# Patient Record
Sex: Female | Born: 1995 | Race: White | Hispanic: No | Marital: Single | Smoking: Never smoker
Health system: Southern US, Community
[De-identification: ages and names within clinical notes are randomized; demographics above are authoritative.]

## PROBLEM LIST (undated history)

## (undated) DIAGNOSIS — E039 Hypothyroidism, unspecified: Secondary | ICD-10-CM

## (undated) DIAGNOSIS — R569 Unspecified convulsions: Secondary | ICD-10-CM

## (undated) DIAGNOSIS — E282 Polycystic ovarian syndrome: Secondary | ICD-10-CM

## (undated) HISTORY — PX: NO PAST SURGERIES: SHX2092

---
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
## 2018-02-14 ENCOUNTER — Emergency Department (HOSPITAL_COMMUNITY): Payer: Self-pay

## 2018-02-14 ENCOUNTER — Other Ambulatory Visit: Payer: Self-pay

## 2018-02-14 ENCOUNTER — Emergency Department (HOSPITAL_COMMUNITY)
Admission: EM | Admit: 2018-02-14 | Discharge: 2018-02-14 | Disposition: A | Discharge status: Home / Self Care | Payer: Self-pay | Attending: Emergency Medicine | Admitting: Emergency Medicine

## 2018-02-14 ENCOUNTER — Encounter (HOSPITAL_COMMUNITY): Payer: Self-pay | Admitting: Emergency Medicine

## 2018-02-14 DIAGNOSIS — E039 Hypothyroidism, unspecified: Secondary | ICD-10-CM | POA: Insufficient documentation

## 2018-02-14 DIAGNOSIS — Z79899 Other long term (current) drug therapy: Secondary | ICD-10-CM | POA: Insufficient documentation

## 2018-02-14 DIAGNOSIS — R0789 Other chest pain: Secondary | ICD-10-CM | POA: Insufficient documentation

## 2018-02-14 DIAGNOSIS — R1011 Right upper quadrant pain: Secondary | ICD-10-CM | POA: Insufficient documentation

## 2018-02-14 DIAGNOSIS — K2970 Gastritis, unspecified, without bleeding: Secondary | ICD-10-CM | POA: Insufficient documentation

## 2018-02-14 HISTORY — DX: Unspecified convulsions: R56.9

## 2018-02-14 HISTORY — DX: Hypothyroidism, unspecified: E03.9

## 2018-02-14 HISTORY — DX: Polycystic ovarian syndrome: E28.2

## 2018-02-14 LAB — CBC
HCT: 46.5 % — ABNORMAL HIGH (ref 36.0–46.0)
Hemoglobin: 15 g/dL (ref 12.0–15.0)
MCH: 29.2 pg (ref 26.0–34.0)
MCHC: 32.3 g/dL (ref 30.0–36.0)
MCV: 90.6 fL (ref 78.0–100.0)
PLATELETS: 248 10*3/uL (ref 150–400)
RBC: 5.13 MIL/uL — AB (ref 3.87–5.11)
RDW: 11.7 % (ref 11.5–15.5)
WBC: 7.1 10*3/uL (ref 4.0–10.5)

## 2018-02-14 LAB — BASIC METABOLIC PANEL
Anion gap: 10 (ref 5–15)
BUN: 7 mg/dL (ref 6–20)
CALCIUM: 9.1 mg/dL (ref 8.9–10.3)
CO2: 23 mmol/L (ref 22–32)
CREATININE: 0.65 mg/dL (ref 0.44–1.00)
Chloride: 107 mmol/L (ref 98–111)
GFR calc non Af Amer: >60 mL/min (ref >60)
GLUCOSE: 102 mg/dL — AB (ref 70–99)
Potassium: 4.1 mmol/L (ref 3.5–5.1)
Sodium: 140 mmol/L (ref 135–145)

## 2018-02-14 LAB — HEPATIC FUNCTION PANEL
ALBUMIN: 3.9 g/dL (ref 3.5–5.0)
ALK PHOS: 68 U/L (ref 38–126)
ALT: 24 U/L (ref 0–44)
AST: 21 U/L (ref 15–41)
BILIRUBIN DIRECT: 0.1 mg/dL (ref 0.0–0.2)
BILIRUBIN TOTAL: 0.4 mg/dL (ref 0.3–1.2)
Indirect Bilirubin: 0.3 mg/dL (ref 0.3–0.9)
Total Protein: 7 g/dL (ref 6.5–8.1)

## 2018-02-14 LAB — I-STAT TROPONIN, ED: TROPONIN I, POC: 0 ng/mL (ref 0.00–0.08)

## 2018-02-14 LAB — D-DIMER, QUANTITATIVE: D-Dimer, Quant: 0.79 ug/mL-FEU — ABNORMAL HIGH (ref 0.00–0.50)

## 2018-02-14 LAB — LIPASE, BLOOD: LIPASE: 40 U/L (ref 11–51)

## 2018-02-14 LAB — HCG, QUANTITATIVE, PREGNANCY: hCG, Beta Chain, Quant, S: 1 m[IU]/mL (ref <5)

## 2018-02-14 LAB — I-STAT CREATININE, ED: Creatinine, Ser: 0.7 mg/dL (ref 0.44–1.00)

## 2018-02-14 MED ORDER — MORPHINE SULFATE (PF) 4 MG/ML IV SOLN
2.0000 mg | Freq: Once | INTRAVENOUS | Status: AC
  Administered 2018-02-14: 2 mg via INTRAVENOUS
  Filled 2018-02-14: qty 1

## 2018-02-14 MED ORDER — DICYCLOMINE HCL 20 MG PO TABS: 20.0000 mg | ORAL_TABLET | Freq: Two times a day (BID) | ORAL | 0 refills | Status: DC

## 2018-02-14 MED ORDER — IOPAMIDOL (ISOVUE-370) INJECTION 76%
INTRAVENOUS | Status: AC
  Filled 2018-02-14: qty 100

## 2018-02-14 MED ORDER — SODIUM CHLORIDE 0.9 % IV BOLUS
1000.0000 mL | Freq: Once | INTRAVENOUS | Status: AC
  Administered 2018-02-14: 1000 mL via INTRAVENOUS

## 2018-02-14 MED ORDER — IOPAMIDOL (ISOVUE-370) INJECTION 76%
100.0000 mL | Freq: Once | INTRAVENOUS | Status: AC | PRN
  Administered 2018-02-14: 100 mL via INTRAVENOUS

## 2018-02-14 MED ORDER — ACETAMINOPHEN 325 MG PO TABS: 650.0000 mg | ORAL_TABLET | Freq: Four times a day (QID) | ORAL | 0 refills | Status: DC | PRN

## 2018-02-14 MED ORDER — FAMOTIDINE 20 MG PO TABS: 20.0000 mg | ORAL_TABLET | Freq: Two times a day (BID) | ORAL | 0 refills | Status: DC

## 2018-02-14 MED ORDER — ONDANSETRON HCL 4 MG/2ML IJ SOLN
4.0000 mg | Freq: Once | INTRAMUSCULAR | Status: AC
  Administered 2018-02-14: 4 mg via INTRAVENOUS
  Filled 2018-02-14: qty 2

## 2018-02-14 MED ORDER — ACETAMINOPHEN 325 MG PO TABS
650.0000 mg | ORAL_TABLET | Freq: Once | ORAL | Status: AC
Start: 2018-02-14 — End: 2018-02-14
  Administered 2018-02-14: 650 mg via ORAL
  Filled 2018-02-14: qty 2

## 2018-02-14 NOTE — ED Notes (Signed)
ED Provider at bedside. 

## 2018-02-14 NOTE — ED Notes (Signed)
Patient transported to Ultrasound 

## 2018-02-14 NOTE — ED Notes (Signed)
Patient transported to X-ray 

## 2018-02-14 NOTE — ED Notes (Signed)
Nurse collected addl labs.

## 2018-02-14 NOTE — ED Provider Notes (Signed)
[facility] EMERGENCY DEPARTMENT Provider Note   CSN: [id number] Arrival date & time: 02/14/18  1809     History   Chief Complaint Chief Complaint  Patient presents with  . Chest Pain    HPI Caitlin Davenport is a 22 y.o. female with a past medical history of PCOS, hypothyroidism, who presents to ED for evaluation of 2-week history of right-sided abdominal pain chest pain.  Pain radiates to the right side of her neck.  Reports associated nausea.  Pain got worse since yesterday.  Worse with inspiration and movement.  She also reports intermittent shortness of breath.  She has been told in the past that she had "sludge in my gallbladder," proximally 3 years ago.  She has not followed up with anyone about it in the time being.  Her mother is concerned that she may have a blood clot because of her tachycardia, the fact that she is on birth control pills and the fact that her mother herself had a saddle embolism.  She has not tried any medicine to help with her symptoms.  Denies any vomiting, hemoptysis, leg swelling, prior PE, MI, DVT, fever, bowel changes, urinary symptoms.  HPI  Past Medical History:  Diagnosis Date  . Hypothyroid   . Polycystic ovarian syndrome   . Seizures (HCC)     There are no active problems to display for this patient.   History reviewed. No pertinent surgical history.   OB History   None      Home Medications    Prior to Admission medications   Medication Sig Start Date End Date Taking? Authorizing Provider  JUNEL FE 1/20 1-20 MG-MCG tablet Take 1 tablet by mouth daily. 01/31/18  Yes [provider]  levothyroxine (SYNTHROID, LEVOTHROID) 50 MCG tablet Take 50 mcg by mouth daily. 01/13/18  Yes [provider]  phentermine (ADIPEX-P) 37.5 MG tablet Take 37.5 mg by mouth daily. 02/07/18  Yes [provider]  acetaminophen (TYLENOL) 325 MG tablet Take 2 tablets (650 mg total) by mouth every 6 (six) hours as needed.  02/14/18   Mckynlee Luse, PA-C  dicyclomine (BENTYL) 20 MG tablet Take 1 tablet (20 mg total) by mouth 2 (two) times daily. 02/14/18   Yida Hyams, PA-C  famotidine (PEPCID) 20 MG tablet Take 1 tablet (20 mg total) by mouth 2 (two) times daily. 02/14/18   Dietrich Pates, PA-C    Family History History reviewed. No pertinent family history.  Social History Social History   Tobacco Use  . Smoking status: Never Smoker  . Smokeless tobacco: Never Used  Substance Use Topics  . Alcohol use: Yes    Comment: occ  . Drug use: Never     Allergies   Patient has no known allergies.   Review of Systems Review of Systems  Constitutional: Negative for appetite change, chills and fever.  HENT: Negative for ear pain, rhinorrhea, sneezing and sore throat.   Eyes: Negative for photophobia and visual disturbance.  Respiratory: Positive for shortness of breath. Negative for cough, chest tightness and wheezing.   Cardiovascular: Positive for chest pain. Negative for palpitations and leg swelling.  Gastrointestinal: Positive for abdominal pain and nausea. Negative for blood in stool, constipation, diarrhea and vomiting.  Genitourinary: Negative for dysuria, hematuria and urgency.  Musculoskeletal: Negative for myalgias.  Skin: Negative for rash.  Neurological: Negative for dizziness, weakness and light-headedness.     Physical Exam Updated Vital Signs BP 125/81   Pulse 91   Temp 98.3  F (36.8 C) (Oral)   Resp 20   Ht 5\' 6"  (1.676 m)   Wt 95.3 kg (210 lb)   LMP 01/13/2018   SpO2 98%   BMI 33.89 kg/m   Physical Exam  Constitutional: She appears well-developed and well-nourished. No distress.  HENT:  Head: Normocephalic and atraumatic.  Nose: Nose normal.  Eyes: Conjunctivae and EOM are normal. Left eye exhibits no discharge. No scleral icterus.  Neck: Normal range of motion. Neck supple.  Cardiovascular: Regular rhythm, normal heart sounds and intact distal pulses. Tachycardia present.  Exam reveals no gallop and no friction rub.  No murmur heard. Pulmonary/Chest: Effort normal and breath sounds normal. No respiratory distress.  Abdominal: Soft. Bowel sounds are normal. She exhibits no distension. There is tenderness in the right upper quadrant. There is no guarding.    Musculoskeletal: Normal range of motion. She exhibits no edema.  No lower extremity edema, erythema or calf tenderness bilaterally.  Neurological: She is alert. She exhibits normal muscle tone. Coordination normal.  Skin: Skin is warm and dry. No rash noted.  Psychiatric: She has a normal mood and affect.  Nursing note and vitals reviewed.    ED Treatments / Results  Labs (all labs ordered are listed, but only abnormal results are displayed) Labs Reviewed  BASIC METABOLIC PANEL - Abnormal; Notable for the following components:      Result Value   Glucose, Bld 102 (*)    All other components within normal limits  CBC - Abnormal; Notable for the following components:   RBC 5.13 (*)    HCT 46.5 (*)    All other components within normal limits  D-DIMER, QUANTITATIVE (NOT AT [facility]) - Abnormal; Notable for the following components:   D-Dimer, Quant 0.79 (*)    All other components within normal limits  HCG, QUANTITATIVE, PREGNANCY  HEPATIC FUNCTION PANEL  LIPASE, BLOOD  I-STAT TROPONIN, ED  I-STAT BETA HCG BLOOD, ED ([facility], [facility], [facility] ONLY)  I-STAT CREATININE, ED    EKG None  Radiology Dg Chest 2 View  Result Date: 02/14/2018 CLINICAL DATA:  Cp. Pt stated that she's been having severe intermittent cp for several weeks that is getting consistently worse by the day. Pt said the pain is in her middle chest, under both breasts, in epigastric area, and in her right neck and clavicle. Pt describes cp as a dull severe pain that's like indigestion x 10. Pt has also experienced sob w/ her chest pain, as well as dizziness, and nausea. Hx; hypothyroid, seizures. Pt was unable to remove her nipple rings. EXAM:  CHEST - 2 VIEW COMPARISON:  None. FINDINGS: The heart size and mediastinal contours are within normal limits. Both lungs are clear. No pleural effusion or pneumothorax. The visualized skeletal structures are unremarkable. IMPRESSION: Normal chest radiographs. Electronically Signed   By: Amie Portland M.D.   On: 02/14/2018 19:27   Ct Angio Chest Pe W/cm &/or Wo Cm  Result Date: 02/14/2018 CLINICAL DATA:  Elevated D-dimer.  Chest pain. EXAM: CT ANGIOGRAPHY CHEST WITH CONTRAST TECHNIQUE: Multidetector CT imaging of the chest was performed using the standard protocol during bolus administration of intravenous contrast. Multiplanar CT image reconstructions and MIPs were obtained to evaluate the vascular anatomy. CONTRAST:  ISOVUE-370 IOPAMIDOL (ISOVUE-370) INJECTION 76% COMPARISON:  Chest radiograph from earlier today. FINDINGS: Cardiovascular: The study is high quality for the evaluation of pulmonary embolism. There are no filling defects in the central, lobar, segmental or subsegmental pulmonary artery branches to suggest acute  pulmonary embolism. Great vessels are normal in course and caliber. Top-normal heart size. No significant pericardial fluid/thickening. Mediastinum/Nodes: No discrete thyroid nodules. Unremarkable esophagus. No pathologically enlarged axillary, mediastinal or hilar lymph nodes. Triangular soft tissue density in the anterior mediastinum with internal speckled fat density is compatible with atrophic thymic tissue (series 6/image 42). Lungs/Pleura: No pneumothorax. No pleural effusion. No acute consolidative airspace disease or lung masses. Solid subpleural anterior bilateral lower lobe 3 mm pulmonary nodules (series 8/image 60 on the right and image 72 on the left). Upper abdomen: No acute abnormality. Musculoskeletal:  No aggressive appearing focal osseous lesions. Review of the MIP images confirms the above findings. IMPRESSION: 1. No pulmonary embolism. 2. Tiny solid pulmonary nodules  in the lower lobes, subpleural, almost certainly benign. No follow-up is required unless the patient has significant risk factors for lung malignancy. Electronically Signed   By: Delbert Phenix M.D.   On: 02/14/2018 22:43   US Abdomen Limited Ruq  Result Date: 02/14/2018 CLINICAL DATA:  Right upper quadrant pain for 2 weeks. EXAM: ULTRASOUND ABDOMEN LIMITED RIGHT UPPER QUADRANT COMPARISON:  None. FINDINGS: Gallbladder: No gallstones or wall thickening visualized. No sonographic Murphy sign noted by sonographer. Common bile duct: Diameter: 3 mm Liver: No focal lesion identified. Within normal limits in parenchymal echogenicity. Portal vein is patent on color Doppler imaging with normal direction of blood flow towards the liver. IMPRESSION: Normal right upper quadrant ultrasound. Electronically Signed   By: Amie Portland M.D.   On: 02/14/2018 20:36    Procedures Procedures (including critical care time)  Medications Ordered in ED Medications  iopamidol (ISOVUE-370) 76 % injection (has no administration in time range)  sodium chloride 0.9 % bolus 1,000 mL (0 mLs Intravenous Stopped 02/14/18 2321)  morphine 4 MG/ML injection 2 mg (2 mg Intravenous Given 02/14/18 1947)  ondansetron (ZOFRAN) injection 4 mg (4 mg Intravenous Given 02/14/18 1947)  iopamidol (ISOVUE-370) 76 % injection 100 mL (100 mLs Intravenous Contrast Given 02/14/18 2210)  acetaminophen (TYLENOL) tablet 650 mg (650 mg Oral Given 02/14/18 2325)     Initial Impression / Assessment and Plan / ED Course  I have reviewed the triage vital signs and the nursing notes.  Pertinent labs & imaging results that were available during my care of the patient were reviewed by me and considered in my medical decision making (see chart for details).     22yo female with a past medical history of PCOS presents for 2-week history of right-sided abdominal pain and chest pain.  Pain radiates to the right side of her neck and is associated with nausea.   She also reports intermittent shortness of breath.  She had been told 3 years ago that she had sludge in her gallbladder.  Her mother is also concerned that she may have a blood clot in her lungs because of her tachycardia and her birth control use.  Mother herself was diagnosed with a saddle embolism recently.  Patient denies any leg swelling, recent surgeries, recent prolonged travel, vomiting, fever, prior MI, PE or DVT.  On physical exam patient is overall well-appearing.  She does have some right upper quadrant tenderness to palpation with no rebound or guarding noted.  She was initially mildly tachycardic.  She has no lower extremity edema or calf tenderness bilaterally.  Remainder vital signs are reassuring.  Labs including CBC, CMP, hCG, lipase, troponin all unremarkable.  D-dimer is elevated 0.78. CXR unremarkable. RUQ U/S is negative. CTA of chest is negative for PE.  Suspect that her symptoms could be due to gastritis or musculoskeletal pain.  Will advise her to follow-up with GI specialist for ongoing problems and will send home with antispasmodics, antacids.  Doubt cardiac or pulmonary cause of symptoms as she is low risk by heart score.  Advised to return to ED for any severe worsening symptoms.  Portions of this note were generated with Scientist, clinical (histocompatibility and immunogenetics)Dragon dictation software. Dictation errors may occur despite best attempts at proofreading.  Final Clinical Impressions(s) / ED Diagnoses   Final diagnoses:  RUQ pain  Chest wall pain  Gastritis without bleeding, unspecified chronicity, unspecified gastritis type    ED Discharge Orders        Ordered    famotidine (PEPCID) 20 MG tablet  2 times daily     02/14/18 2324    dicyclomine (BENTYL) 20 MG tablet  2 times daily     02/14/18 2324    acetaminophen (TYLENOL) 325 MG tablet  Every 6 hours PRN     02/14/18 2324       Dietrich PatesKhatri, Marteze Vecchio, PA-C 02/14/18 2335    Azalia Bilisampos, Kevin, MD 02/15/18 [phone]

## 2018-02-14 NOTE — ED Triage Notes (Signed)
Pt states she has been having CP for several weeks- intermittently. Pt states it is severe in nature and goes into her right neck, epigastrium. Pt also feels very nauseous.

## 2018-02-14 NOTE — Discharge Instructions (Signed)
Return to ED for any worsening symptoms, severe chest pain or shortness of breath, coughing up blood, leg swelling, vomiting up blood.

## 2018-12-01 IMAGING — CT CT ANGIO CHEST
2 of 6 series · 18 of 36 positions shown · IV contrast (Omni 300)
Comparison: Chest radiograph from earlier today.

CLINICAL DATA: Elevated D-dimer.  Chest pain.

EXAM:
CT ANGIOGRAPHY CHEST WITH CONTRAST
TECHNIQUE: Multidetector CT imaging of the chest was performed using the
standard protocol during bolus administration of intravenous
contrast. Multiplanar CT image reconstructions and MIPs were
obtained to evaluate the vascular anatomy.
CONTRAST:  100mL KCTQS5-LPG IOPAMIDOL (KCTQS5-LPG) INJECTION 76%

[Series 7: pe thins · axial · 0.66mm/px · z∈[+1275,+1488]mm · 17 of 241 slices shown]
[im 14/241  lung]
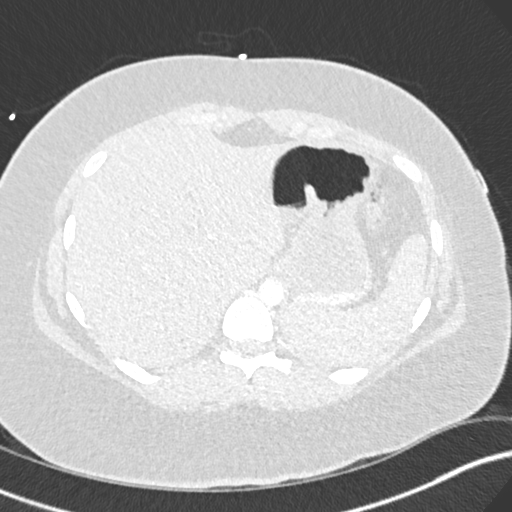
[im 27/241  mediastinal]
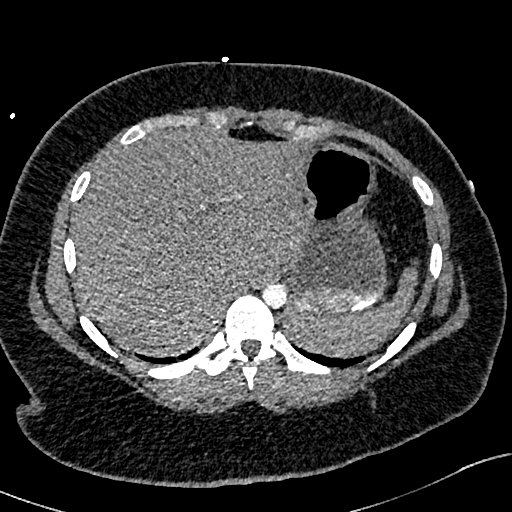
[im 41/241  lung]
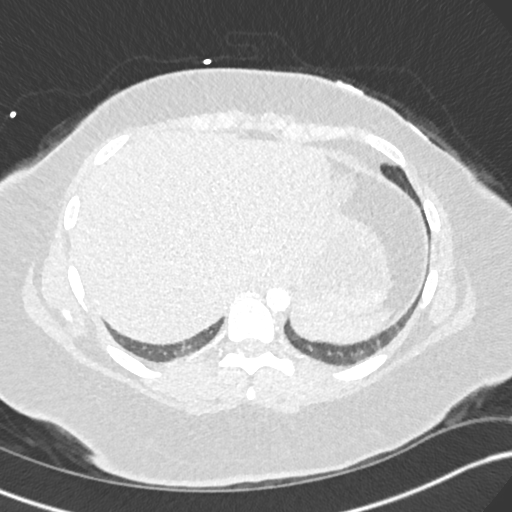
[im 54/241  mediastinal]
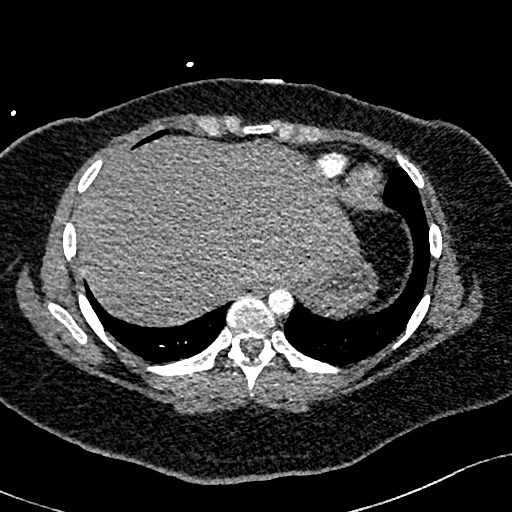
[im 67/241  lung]
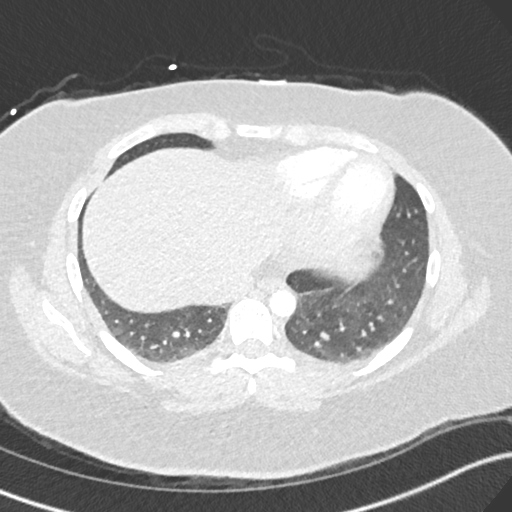
[im 81/241  mediastinal]
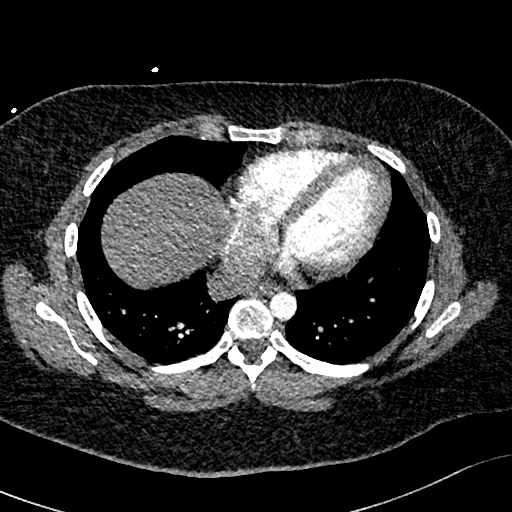
[im 94/241  lung]
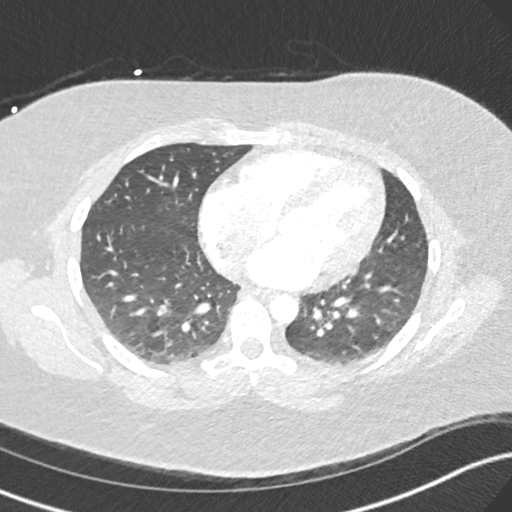
[im 107/241  mediastinal]
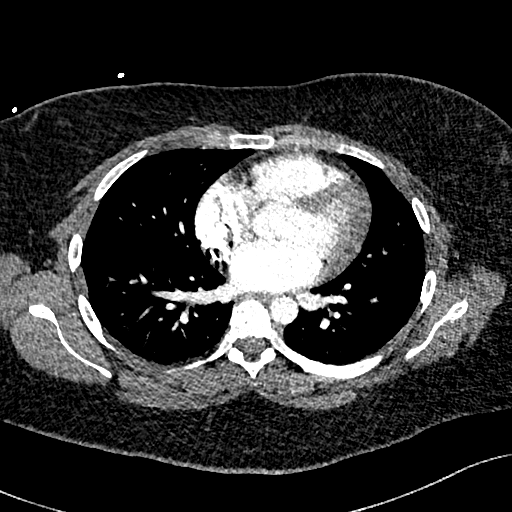
[im 121/241  lung]
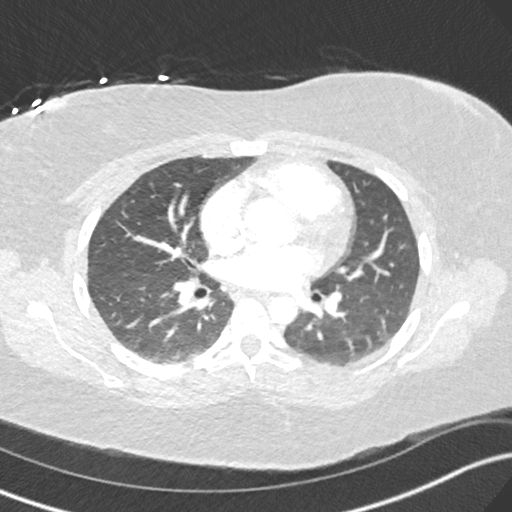
[im 134/241  mediastinal]
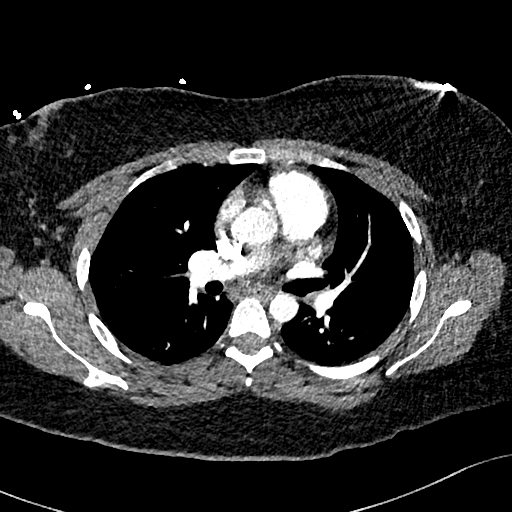
[im 147/241  lung]
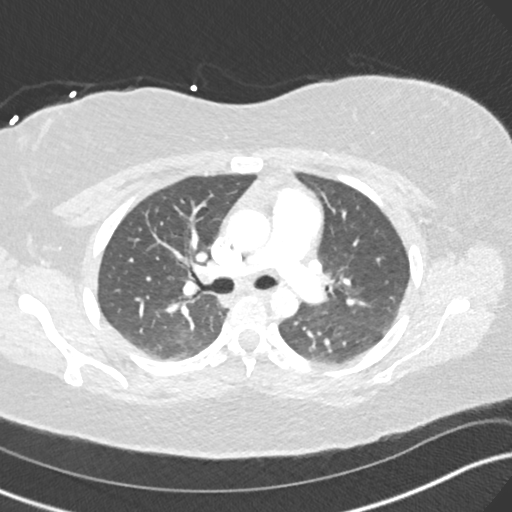
[im 161/241  mediastinal]
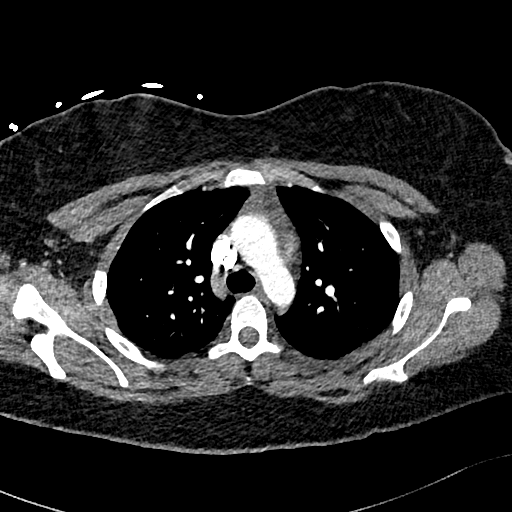
[im 174/241  lung]
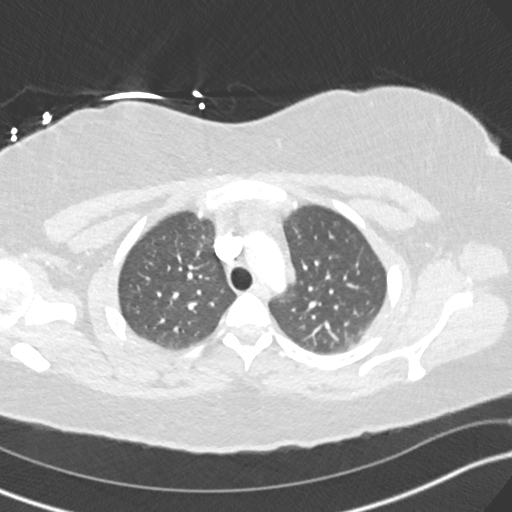
[im 187/241  mediastinal]
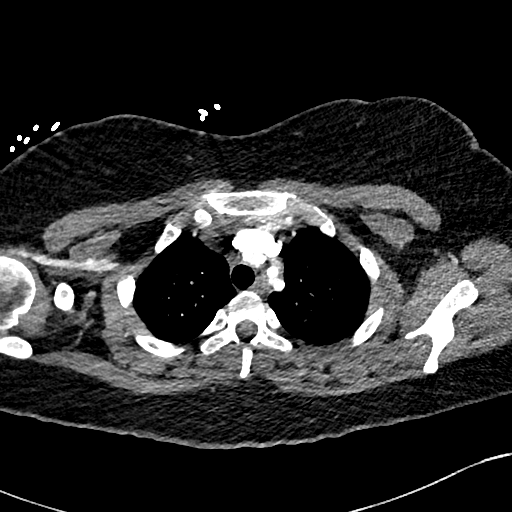
[im 201/241  lung]
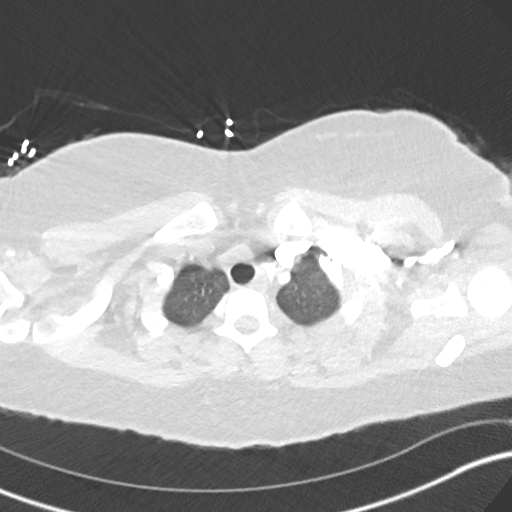
[im 214/241  mediastinal]
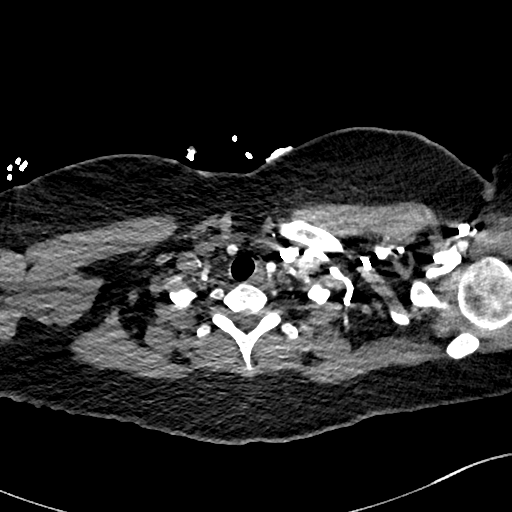
[im 227/241  lung]
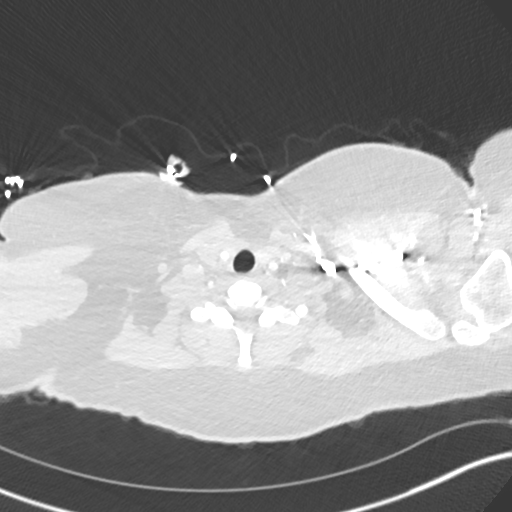

[Series 9: pe 2mm cor · coronal · 0.48mm/px · 1 of 132 slices shown]
[im 66/132  mediastinal]
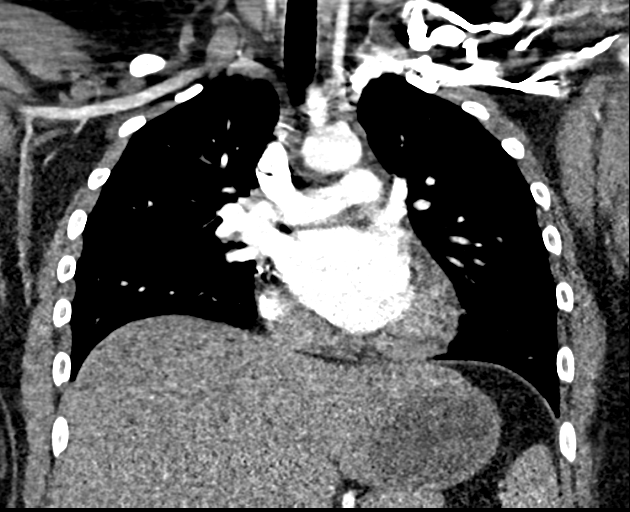

[18 of 36 positions shown; findings below may reference images not displayed]

FINDINGS: Cardiovascular: The study is high quality for the evaluation of
pulmonary embolism. There are no filling defects in the central,
lobar, segmental or subsegmental pulmonary artery branches to
suggest acute pulmonary embolism. Great vessels are normal in course
and caliber. Top-normal heart size. No significant pericardial
fluid/thickening.

Mediastinum/Nodes: No discrete thyroid nodules. Unremarkable
esophagus. No pathologically enlarged axillary, mediastinal or hilar
lymph nodes. Triangular soft tissue density in the anterior
mediastinum with internal speckled fat density is compatible with
atrophic thymic tissue (series 6/image 42).

Lungs/Pleura: No pneumothorax. No pleural effusion. No acute
consolidative airspace disease or lung masses. Solid subpleural
anterior bilateral lower lobe 3 mm pulmonary nodules (series 8/image
60 on the right and image 72 on the left).

Upper abdomen: No acute abnormality.

Musculoskeletal:  No aggressive appearing focal osseous lesions.

Review of the MIP images confirms the above findings.
IMPRESSION: 1. No pulmonary embolism.
2. Tiny solid pulmonary nodules in the lower lobes, subpleural,
almost certainly benign. No follow-up is required unless the patient
has significant risk factors for lung malignancy.

## 2018-12-01 IMAGING — DX DG CHEST 2V
2 series · 2 of 2 positions shown · non-contrast
Comparison: None.

CLINICAL DATA: Cp. Pt stated that she's been having severe
intermittent cp for several weeks that is getting consistently worse
by the day. Pt said the pain is in her middle chest, under both
breasts, in epigastric area, and in her right neck and clavicle. Pt
describes cp as a dull severe pain that's like indigestion x 10. Pt
has also experienced sob w/ her chest pain, as well as dizziness,
and nausea. Hx; hypothyroid, seizures. Pt was unable to remove her
nipple rings.

EXAM:
CHEST - 2 VIEW

[chest pa]
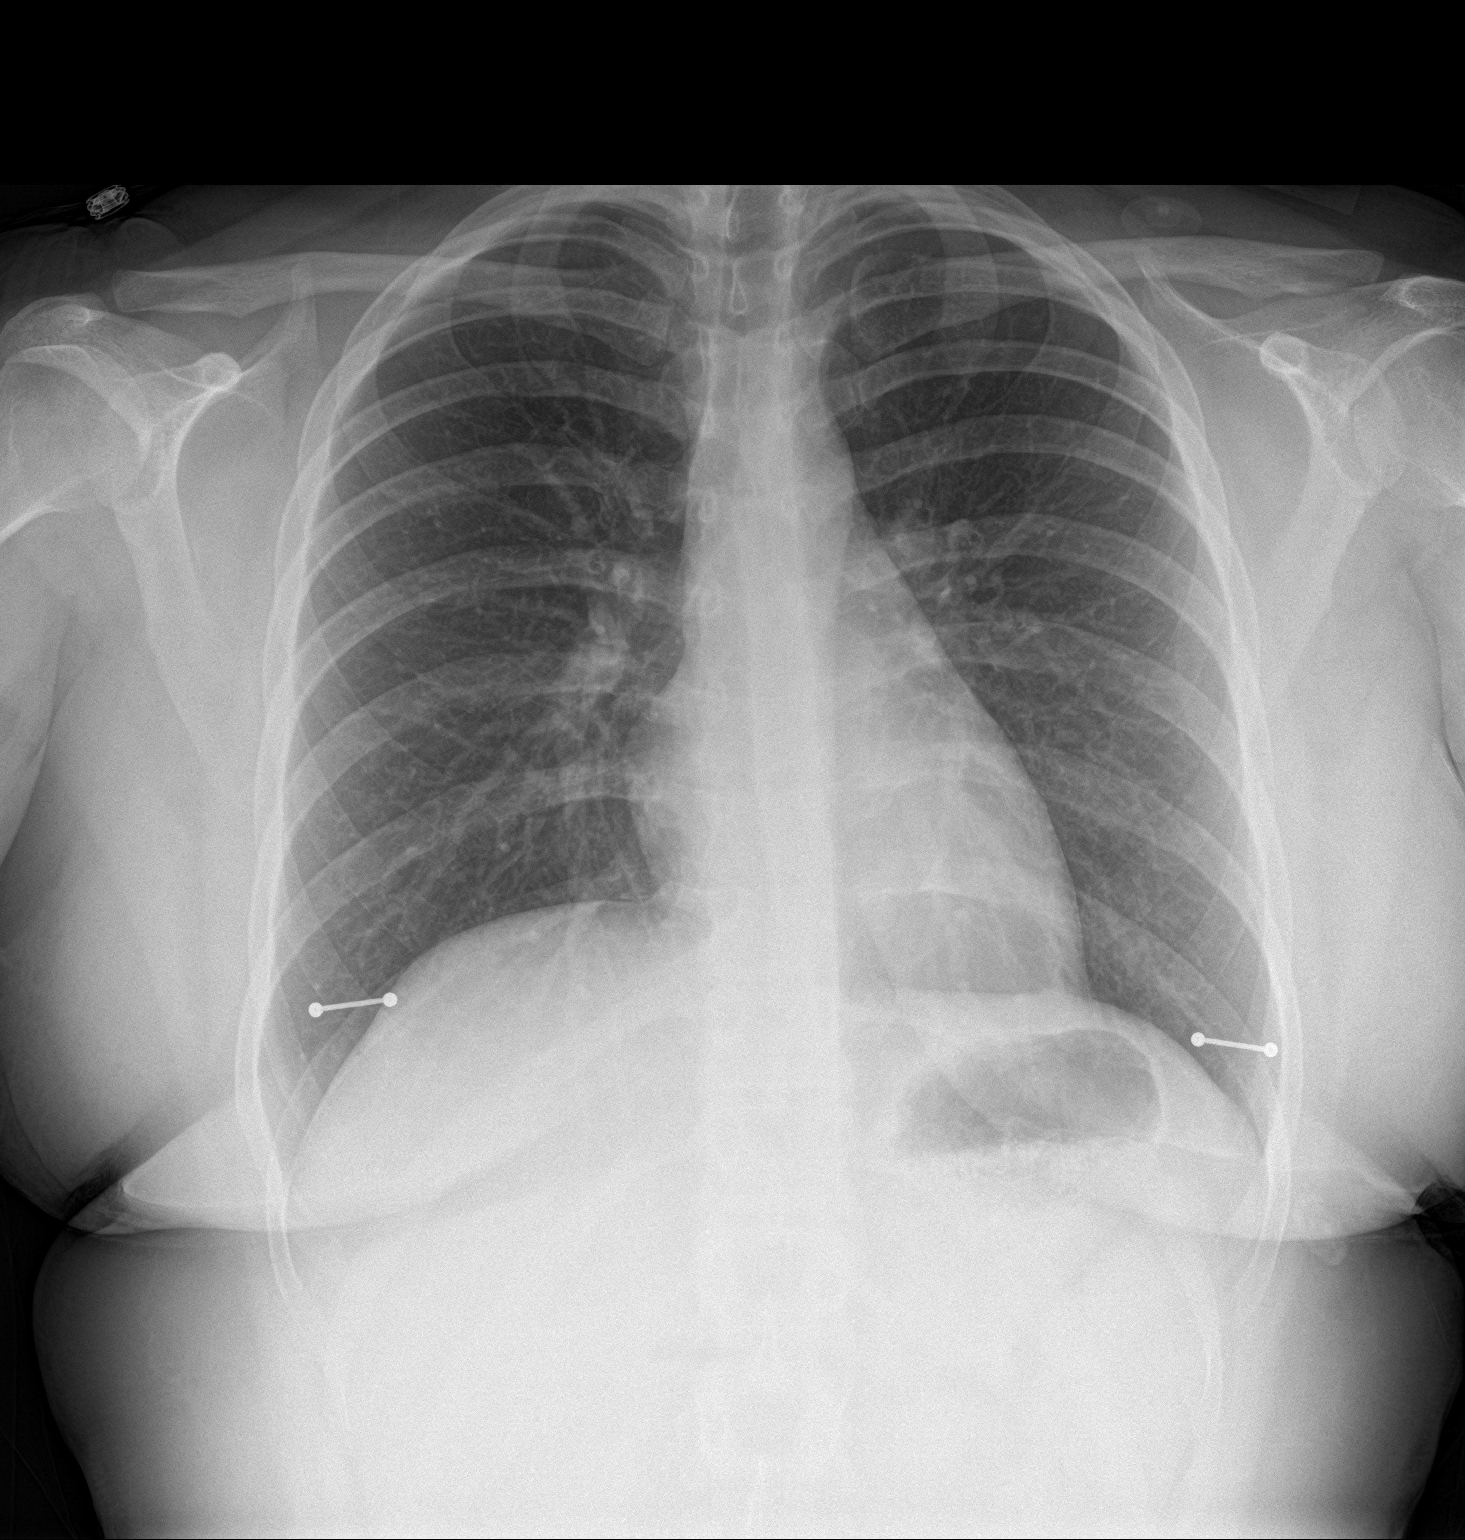

[chest lat]
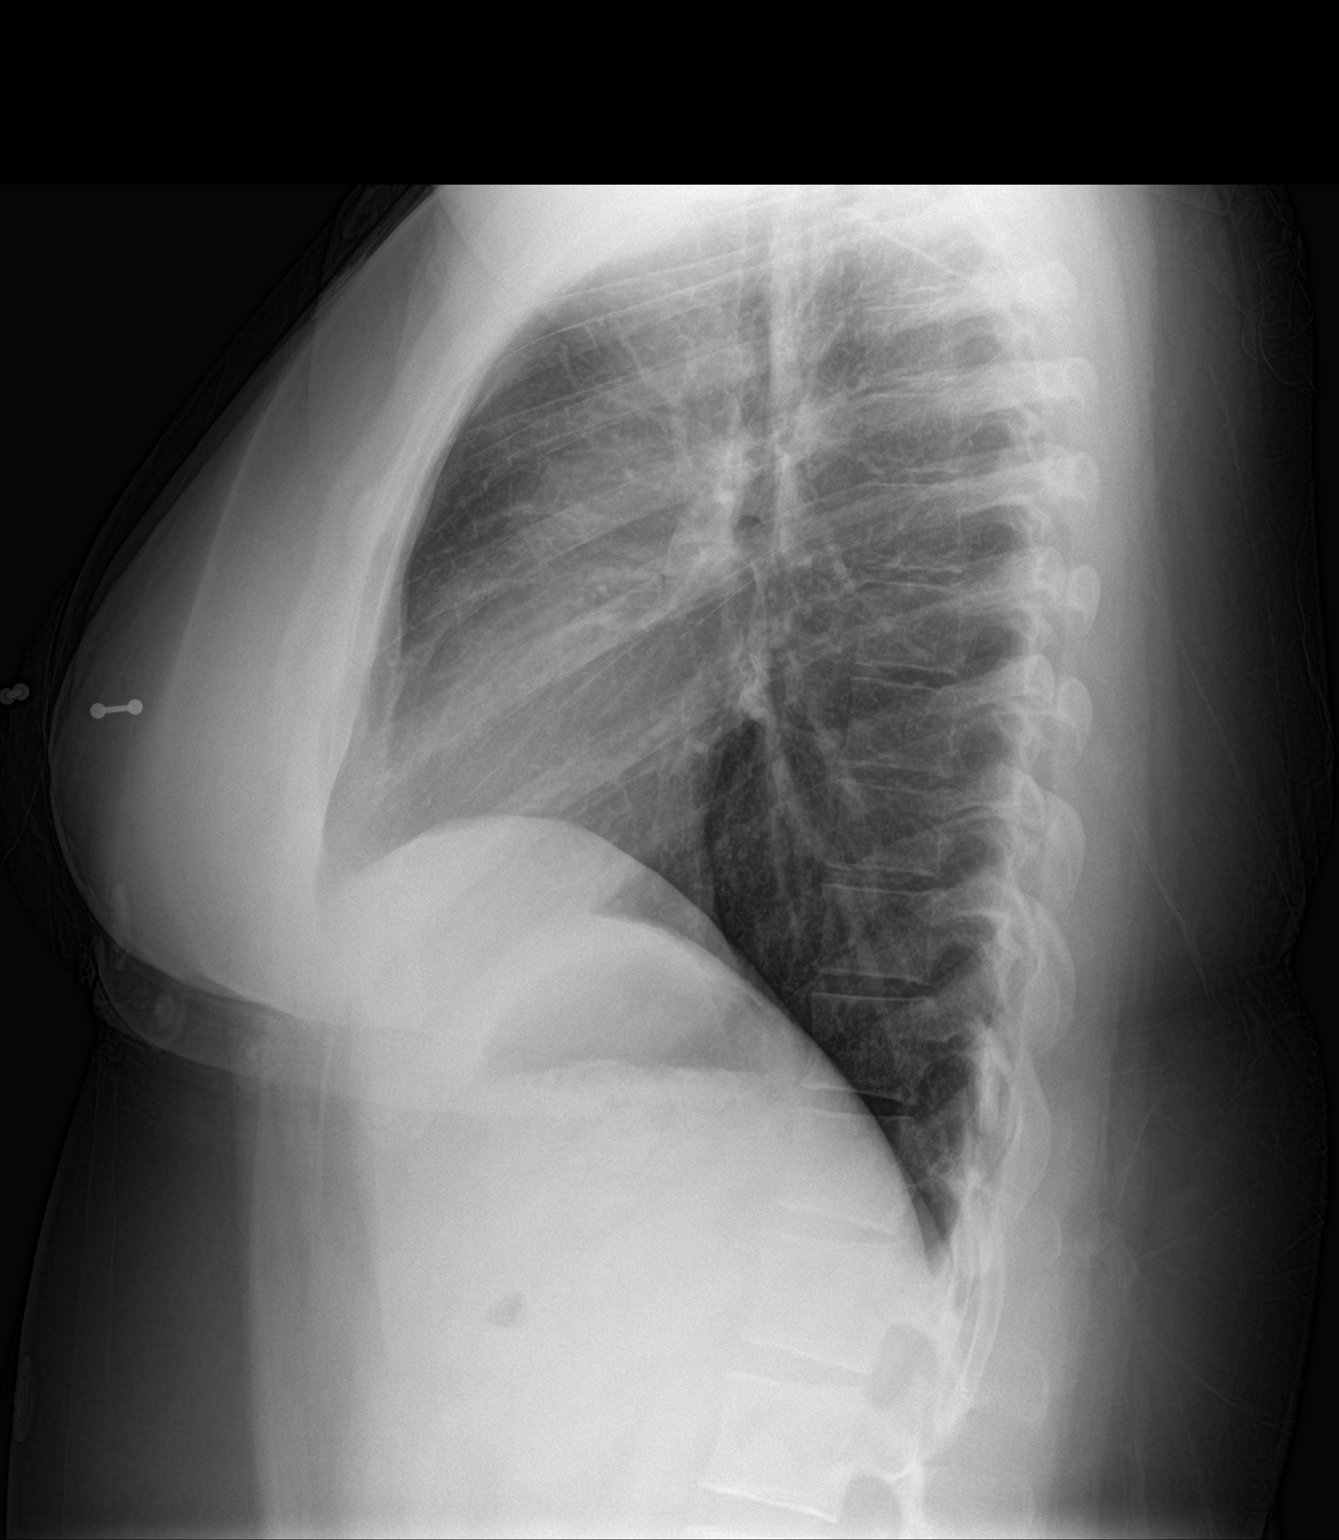

[2 of 2 positions shown; findings below may reference images not displayed]

FINDINGS: The heart size and mediastinal contours are within normal limits.
Both lungs are clear. No pleural effusion or pneumothorax. The
visualized skeletal structures are unremarkable.
IMPRESSION: Normal chest radiographs.

## 2019-06-13 ENCOUNTER — Other Ambulatory Visit: Payer: Self-pay

## 2019-06-13 ENCOUNTER — Inpatient Hospital Stay (HOSPITAL_COMMUNITY)
Admission: AD | Admit: 2019-06-13 | Discharge: 2019-06-17 | DRG: 177 | Disposition: A | Discharge status: Home / Self Care | Payer: HRSA Program | Source: Other Acute Inpatient Hospital | Attending: Internal Medicine | Admitting: Internal Medicine

## 2019-06-13 ENCOUNTER — Encounter (HOSPITAL_COMMUNITY): Payer: Self-pay

## 2019-06-13 DIAGNOSIS — J9601 Acute respiratory failure with hypoxia: Secondary | ICD-10-CM | POA: Diagnosis present

## 2019-06-13 DIAGNOSIS — E039 Hypothyroidism, unspecified: Secondary | ICD-10-CM

## 2019-06-13 DIAGNOSIS — Z79890 Hormone replacement therapy (postmenopausal): Secondary | ICD-10-CM | POA: Diagnosis not present

## 2019-06-13 DIAGNOSIS — R569 Unspecified convulsions: Secondary | ICD-10-CM

## 2019-06-13 DIAGNOSIS — J1289 Other viral pneumonia: Secondary | ICD-10-CM | POA: Diagnosis present

## 2019-06-13 DIAGNOSIS — J069 Acute upper respiratory infection, unspecified: Secondary | ICD-10-CM

## 2019-06-13 DIAGNOSIS — U071 COVID-19: Secondary | ICD-10-CM | POA: Diagnosis present

## 2019-06-13 DIAGNOSIS — G40909 Epilepsy, unspecified, not intractable, without status epilepticus: Secondary | ICD-10-CM | POA: Diagnosis present

## 2019-06-13 DIAGNOSIS — Z79899 Other long term (current) drug therapy: Secondary | ICD-10-CM

## 2019-06-13 HISTORY — DX: Acute upper respiratory infection, unspecified: J06.9

## 2019-06-13 HISTORY — DX: Unspecified convulsions: R56.9

## 2019-06-13 HISTORY — DX: COVID-19: U07.1

## 2019-06-13 HISTORY — DX: Hypothyroidism, unspecified: E03.9

## 2019-06-13 MED ORDER — NORETHIN ACE-ETH ESTRAD-FE 1-20 MG-MCG PO TABS: 1.0000 | ORAL_TABLET | Freq: Every day | ORAL | Status: DC

## 2019-06-13 MED ORDER — ONDANSETRON HCL 4 MG/2ML IJ SOLN: 4.0000 mg | Freq: Four times a day (QID) | INTRAMUSCULAR | Status: DC | PRN

## 2019-06-13 MED ORDER — LEVOTHYROXINE SODIUM 75 MCG PO TABS
75.0000 ug | ORAL_TABLET | Freq: Every day | ORAL | Status: DC
  Administered 2019-06-13 – 2019-06-16 (×4): 75 ug via ORAL
  Filled 2019-06-13 (×4): qty 1

## 2019-06-13 MED ORDER — DEXAMETHASONE 6 MG PO TABS
6.0000 mg | ORAL_TABLET | ORAL | Status: DC
  Administered 2019-06-13 – 2019-06-16 (×4): 6 mg via ORAL
  Filled 2019-06-13 (×4): qty 1

## 2019-06-13 MED ORDER — ONDANSETRON HCL 4 MG PO TABS: 4.0000 mg | ORAL_TABLET | Freq: Four times a day (QID) | ORAL | Status: DC | PRN

## 2019-06-13 MED ORDER — ZINC SULFATE 220 (50 ZN) MG PO CAPS
220.0000 mg | ORAL_CAPSULE | Freq: Every day | ORAL | Status: DC
  Administered 2019-06-13 – 2019-06-17 (×5): 220 mg via ORAL
  Filled 2019-06-13 (×5): qty 1

## 2019-06-13 MED ORDER — DOCUSATE SODIUM 100 MG PO CAPS
100.0000 mg | ORAL_CAPSULE | Freq: Two times a day (BID) | ORAL | Status: DC
  Administered 2019-06-13 – 2019-06-16 (×6): 100 mg via ORAL
  Filled 2019-06-13 (×9): qty 1

## 2019-06-13 MED ORDER — FAMOTIDINE 20 MG PO TABS
20.0000 mg | ORAL_TABLET | Freq: Two times a day (BID) | ORAL | Status: DC
  Administered 2019-06-13 – 2019-06-17 (×8): 20 mg via ORAL
  Filled 2019-06-13 (×9): qty 1

## 2019-06-13 MED ORDER — SODIUM CHLORIDE 0.9 % IV SOLN
100.0000 mg | INTRAVENOUS | Status: DC
  Administered 2019-06-14 – 2019-06-16 (×3): 100 mg via INTRAVENOUS
  Filled 2019-06-13 (×4): qty 20

## 2019-06-13 MED ORDER — HYDROCOD POLST-CPM POLST ER 10-8 MG/5ML PO SUER
5.0000 mL | Freq: Two times a day (BID) | ORAL | Status: DC | PRN
  Administered 2019-06-16: 5 mL via ORAL
  Filled 2019-06-13: qty 5

## 2019-06-13 MED ORDER — ACETAMINOPHEN 325 MG PO TABS: 650.0000 mg | ORAL_TABLET | Freq: Four times a day (QID) | ORAL | Status: DC | PRN

## 2019-06-13 MED ORDER — GUAIFENESIN-DM 100-10 MG/5ML PO SYRP
10.0000 mL | ORAL_SOLUTION | ORAL | Status: DC | PRN
  Administered 2019-06-14 – 2019-06-15 (×4): 10 mL via ORAL
  Filled 2019-06-13 (×4): qty 10

## 2019-06-13 MED ORDER — VITAMIN C 500 MG PO TABS
500.0000 mg | ORAL_TABLET | Freq: Every day | ORAL | Status: DC
  Administered 2019-06-13 – 2019-06-17 (×5): 500 mg via ORAL
  Filled 2019-06-13 (×5): qty 1

## 2019-06-13 MED ORDER — SODIUM CHLORIDE 0.9 % IV SOLN
200.0000 mg | Freq: Once | INTRAVENOUS | Status: AC
  Administered 2019-06-13: 200 mg via INTRAVENOUS
  Filled 2019-06-13: qty 40

## 2019-06-13 MED ORDER — ENOXAPARIN SODIUM 40 MG/0.4ML ~~LOC~~ SOLN
40.0000 mg | SUBCUTANEOUS | Status: DC
  Administered 2019-06-13 – 2019-06-16 (×4): 40 mg via SUBCUTANEOUS
  Filled 2019-06-13 (×4): qty 0.4

## 2019-06-13 MED ORDER — LEVETIRACETAM ER 500 MG PO TB24
1000.0000 mg | ORAL_TABLET | Freq: Every day | ORAL | Status: DC
  Administered 2019-06-14 – 2019-06-16 (×4): 1000 mg via ORAL
  Filled 2019-06-13 (×4): qty 2

## 2019-06-13 MED ORDER — VITAMIN D (ERGOCALCIFEROL) 1.25 MG (50000 UNIT) PO CAPS
50000.0000 [IU] | ORAL_CAPSULE | ORAL | Status: DC
  Administered 2019-06-13: 50000 [IU] via ORAL
  Filled 2019-06-13: qty 1

## 2019-06-13 NOTE — Progress Notes (Signed)
Pharmacy Note - Remdesivir Dosing  O:  ALT: 84  CXR: multifocal pneumonia Requiring supplemental O2: 1L [place]   A/P:  Patient meets criteria for remdesivir.  Begin remdesivir 200 mg IV x 1, followed by 100 mg IV daily x 4 days  Monitor ALT, clinical progress  Peggyann Juba, PharmD, [place] [phone] 06/13/2019 3:57 PM

## 2019-06-13 NOTE — H&P (Signed)
TRH H&P   Patient Demographics:    Lakaya Tolen, is a 23 y.o. female  MRN: [id number]   DOB - 1996/07/28  Admit Date - 06/13/2019  Outpatient Primary MD for the patient is Patient, No Pcp Per  Referring MD/NP/PA: PA Caitlyn  Patient coming from: [facility] ED  No chief complaint on file.     HPI:    Finnleigh Marchetti  is a 23 y.o. female, with past medical history of epilepsy, hypothyroidism, patient presents to [facility] ED secondary to shortness of breath, patient was diagnosed with COVID-19 10/14, she reports generalized weakness, cough, shortness of breath, some sputum production, fever, chills, generalized body ache, no nausea, no vomiting, no diarrhea, patient had ED visit 10/19, where she was discharged home given she was not hypoxic, patient report worsening shortness of breath over last few days, patient mother is hospitalized here at [facility] for COVID-19 for pneumonia as well. -At [facility] ED patient was noted to be hypoxic 87% on room air, requiring 2 L nasal cannula, chest x-ray significant for bilateral opacity, initially tachycardic at 140, then this improved after she received steroids and started on oxygen, D-dimers were elevated at 760 (normal is 500), ferritin elevated at 748, LDH elevated at 890, platelet count was 123K, around her baseline, CRP elevated at 40, (normal is 10), she had negative urine pregnancy test, procalcitonin was normal, she was given IV steroids and transferred to [facility] for further management.    Review of systems:    In addition to the HPI above,  Reports fever, chills, generalized weakness and fatigue No Headache, No changes with Vision or hearing, No problems swallowing food or Liquids, No Chest pain, complains of cough and shortness of breath No Abdominal pain, No Nausea or Vommitting, Bowel movements are regular, No Blood  in stool or Urine, No dysuria, No new skin rashes or bruises, No new joints pains-aches,  No new weakness, tingling, numbness in any extremity, No recent weight gain or loss, No polyuria, polydypsia or polyphagia, No significant Mental Stressors.  A full 10 point Review of Systems was done, except as stated above, all other Review of Systems were negative.   With Past History of the following :    Past Medical History:  Diagnosis Date  . Hypothyroid   . Polycystic ovarian syndrome   . Seizures (HCC)       History reviewed. No pertinent surgical history.    Social History:     Social History   Tobacco Use  . Smoking status: Never Smoker  . Smokeless tobacco: Never Used  Substance Use Topics  . Alcohol use: Yes    Comment: occ       Family History :   No family history of diabetes or high blood pressure in the family, family history significant for COVID-19 and her mom, and breast cancer for maternal grandmother.  Home Medications:   Prior to Admission medications   Medication Sig Start Date End Date Taking? Authorizing Provider  acetaminophen (TYLENOL) 500 MG tablet Take 1,000 mg by mouth See admin instructions. Take 1,000 mg by mouth every 4-6 hours as needed for fever or mild pain   Yes [provider]  levETIRAcetam (KEPPRA XR) 500 MG 24 hr tablet Take 1,000 mg by mouth at bedtime.   Yes [provider]  levothyroxine (SYNTHROID) 75 MCG tablet Take 75 mcg by mouth at bedtime.   Yes [provider]  Vitamin D, Ergocalciferol, (DRISDOL) 1.25 MG (50000 UT) CAPS capsule Take 50,000 Units by mouth every Friday. 12/17/18  Yes [provider]  acetaminophen (TYLENOL) 325 MG tablet Take 2 tablets (650 mg total) by mouth every 6 (six) hours as needed. Patient not taking: Reported on 06/13/2019 02/14/18   Dietrich PatesKhatri, Hina, PA-C  dicyclomine (BENTYL) 20 MG tablet Take 1 tablet (20 mg total) by mouth 2 (two) times daily. Patient not taking:  Reported on 06/13/2019 02/14/18   Dietrich PatesKhatri, Hina, PA-C  famotidine (PEPCID) 20 MG tablet Take 1 tablet (20 mg total) by mouth 2 (two) times daily. Patient not taking: Reported on 06/13/2019 02/14/18   Dietrich PatesKhatri, Hina, PA-C  JUNEL FE 1/20 1-20 MG-MCG tablet Take 1 tablet by mouth daily. 01/31/18   [provider]  phentermine (ADIPEX-P) 37.5 MG tablet Take 37.5 mg by mouth daily. 02/07/18   [provider]     Allergies:    No Known Allergies   Physical Exam:   Vitals  Blood pressure 120/70, pulse 68, temperature 99.3 F (37.4 C), temperature source Oral, resp. rate 20, height 5\' 6"  (1.676 m), weight 84.6 kg, SpO2 97 %.   1. General developed female laying in bed in no apparent distress  2. Normal affect and insight, Not Suicidal or Homicidal, Awake Alert, Oriented X 3.  3. No F.N deficits, ALL C.Nerves Intact, Strength 5/5 all 4 extremities, Sensation intact all 4 extremities, Plantars down going.  4. Ears and Eyes appear Normal, Conjunctivae clear, PERRLA. Moist Oral Mucosa.  5. Supple Neck, No JVD, No cervical lymphadenopathy appriciated, No Carotid Bruits.  6. Symmetrical Chest wall movement, Good air movement bilaterally, CTAB.  7. RRR, No Gallops, Rubs or Murmurs, No Parasternal Heave.  8. Positive Bowel Sounds, Abdomen Soft, No tenderness, No organomegaly appriciated,No rebound -guarding or rigidity.  9.  No Cyanosis, Normal Skin Turgor, No Skin Rash or Bruise.  10. Good muscle tone,  joints appear normal , no effusions, Normal ROM.  11. No Palpable Lymph Nodes in Neck or Axillae   SHEENT was seen and examined with the presence of female chaperone Melissa RN   Data Review:    06/13/19 08:53: Sodium 140, Potassium 3.6, Chloride 105, Carbon Dioxide 26, Anion Gap 13, BUN 7, Creatinine 0.70, Estimated GFR (MDRD) > 60, Glucose 121 H, Calculated Osmolality 268 L, Calcium 8.2 L, Prot Corrected Calcium 8.5, Total Bilirubin 0.7, AST 76 H, ALT 84 H, Alkaline  Phosphatase 86, Lactate Dehydrogenase 890 H, Troponin I < 0.01, C-Reactive Prot, Quant 40.3 H, Pro-B-Natriuretic Pept 25, Total Protein 6.8, Albumin 3.7 06/13/19 08:53: Procalcitonin < 0.05 06/13/19 08:53: Lactic Acid 1.3 06/13/19 08:53: Ferritin 748.0 H 06/13/19 08:53: WBC 3.8, RBC 5.12, Hgb 15.0, Hct 44.5, MCV 87, MCH 29.4, MCHC 33.8, RDW 12.7, Plt Count 123 L, MPV 9.1, Neut % (Auto) 54.7, Lymph % (Auto) 37.3, Mono % (Auto) 6.5, Eos % (Auto) 1.1, Baso % (Auto) 0.4, Absolute Neuts (auto)  2.05, Absolute Lymphs (auto) 1.41 06/13/19 08:53: PT 10.6, INR 1.0, APTT 27.9 06/13/19 08:53: D-Dimer Quant (PE/DVT) 769 H 06/13/19 09:02: Urine Pregnancy Test NEG 06/13/19 09:02: Urine Color YELLOW, Urine Clarity CLEAR, Urine pH 6.0, Ur Specific Gravity 1.010, Urine Protein NEG, Urine Glucose (UA) NEG, Urine Ketones NEG, Urine Occult Blood NEG, Urine Nitrite NEG, Urine Bilirubin NEG, Urine Urobilinogen 0.2, Ur Leukocyte Esterase NEG, Urine RBC 0-2, Urine WBC 2-5, Ur Epithelial Cells 2+, Urine Bacteria FEW, Urine Mucus OCC   Imaging Results:    Exam(s): [id number] RAD/DG CHEST PORTABLE CLINICAL DATA:  Patient presents with gradual worsening shortness of breath and cough as well as fevers, chills and myalgias. History reports patient has COVID-19.  EXAM: PORTABLE CHEST 1 VIEW  COMPARISON:  06/09/2019  FINDINGS: Cardiac silhouette normal in size. Normal mediastinal contours. No hilar masses or convincing adenopathy.  Subtle hazy perihilar opacities are suggested. Lungs otherwise clear. No pleural effusion or pneumothorax.  Skeletal structures are grossly intact.  IMPRESSION: 1. Subtle perihilar airspace lung opacities are suggested consistent with multifocal pneumonia. This would be better assessed with chest CT without contrast.   Electronically Signed   By: Lajean Manes M.D.   On: 06/13/2019 10:25  Assessment & Plan:    Active Problems:   Hypothyroidism   Seizure ([place])   Acute  respiratory disease due to COVID-19 virus    Acute hypoxic respiratory failure due to COVID-19 for pneumonia -Patient hypoxic on room air 87%, requiring 2 L nasal cannula, chest x-ray significant for bilateral opacities, CRP elevated, ferritin is elevated, LDH is elevated, patient started on IV Decadron, and IV remdesivir, will continue to trend inflammatory markers closely, discussed at length with the patient, she was encouraged to use incentive spirometry, flutter valve, out of bed to chair and to prone.  History of seizures -Continue with home dose Keppra  Hypothyroidism -Continue with Synthroid  Patient reports she has stopped taking phentermine for more than 1 year, will not order during hospital stay  DVT Prophylaxis  Lovenox - SCDs   AM Labs Ordered, also please review Full Orders  Family Communication: Admission, patients condition and plan of care including tests being ordered have been discussed with the patient who indicate understanding and agree with the plan and Code Status.  Code Status full  Likely DC to home  Condition GUARDED   Consults called: None  Admission status: Inpatient  Time spent in minutes : 55 minutes   Phillips Climes M.D on 06/13/2019 at 5:55 PM  Between 7am to 7pm - Pager - [phone]. After 7pm go to [email or web address] - password [facility]  [facility] - Office  [phone]

## 2019-06-13 NOTE — Plan of Care (Signed)
Care plan started

## 2019-06-13 NOTE — Progress Notes (Addendum)
RN called stating that pt didn't feel comfortable taking generic Keppra as she doesn't think that it works for her. I spoke with patient about the bioequivalence of generics and let her know that we do not carry the brand name Keppra. She wants to have her family bring in from home tomorrow. She will think about whether she will take tonight's dose as a generic.  Sherlon Handing, PharmD, BCPS CGV Clinical pharmacist phone [phone] 06/13/2019 10:23 PM    Addendum: Family brought in Brand Name Keppra XR 500mg . Being stored in pharmacy vault and we will be dispensing each dose and tubing secure to RN.  Sherlon Handing, PharmD, BCPS CGV Clinical pharmacist phone [phone] 06/14/2019 1:05 AM

## 2019-06-14 LAB — CBC WITH DIFFERENTIAL/PLATELET
Abs Immature Granulocytes: 0.01 10*3/uL (ref 0.00–0.07)
Basophils Absolute: 0 10*3/uL (ref 0.0–0.1)
Basophils Relative: 1 %
Eosinophils Absolute: 0 10*3/uL (ref 0.0–0.5)
Eosinophils Relative: 0 %
HCT: 44.9 % (ref 36.0–46.0)
Hemoglobin: 15 g/dL (ref 12.0–15.0)
Immature Granulocytes: 0 %
Lymphocytes Relative: 33 %
Lymphs Abs: 0.7 10*3/uL (ref 0.7–4.0)
MCH: 29.6 pg (ref 26.0–34.0)
MCHC: 33.4 g/dL (ref 30.0–36.0)
MCV: 88.7 fL (ref 80.0–100.0)
Monocytes Absolute: 0.1 10*3/uL (ref 0.1–1.0)
Monocytes Relative: 5 %
Neutro Abs: 1.3 10*3/uL — ABNORMAL LOW (ref 1.7–7.7)
Neutrophils Relative %: 61 %
Platelets: 156 10*3/uL (ref 150–400)
RBC: 5.06 MIL/uL (ref 3.87–5.11)
RDW: 12.4 % (ref 11.5–15.5)
WBC: 2.2 10*3/uL — ABNORMAL LOW (ref 4.0–10.5)
nRBC: 0 % (ref 0.0–0.2)

## 2019-06-14 LAB — ABO/RH: ABO/RH(D): A POS

## 2019-06-14 LAB — COMPREHENSIVE METABOLIC PANEL
ALT: 64 U/L — ABNORMAL HIGH (ref 0–44)
AST: 43 U/L — ABNORMAL HIGH (ref 15–41)
Albumin: 3.8 g/dL (ref 3.5–5.0)
Alkaline Phosphatase: 68 U/L (ref 38–126)
Anion gap: 12 (ref 5–15)
BUN: 8 mg/dL (ref 6–20)
CO2: 22 mmol/L (ref 22–32)
Calcium: 8.9 mg/dL (ref 8.9–10.3)
Chloride: 108 mmol/L (ref 98–111)
Creatinine, Ser: 0.64 mg/dL (ref 0.44–1.00)
GFR calc Af Amer: >60 mL/min (ref >60)
GFR calc non Af Amer: >60 mL/min (ref >60)
Glucose, Bld: 155 mg/dL — ABNORMAL HIGH (ref 70–99)
Potassium: 4.1 mmol/L (ref 3.5–5.1)
Sodium: 142 mmol/L (ref 135–145)
Total Bilirubin: 0.7 mg/dL (ref 0.3–1.2)
Total Protein: 7.5 g/dL (ref 6.5–8.1)

## 2019-06-14 LAB — D-DIMER, QUANTITATIVE: D-Dimer, Quant: 0.66 ug/mL-FEU — ABNORMAL HIGH (ref 0.00–0.50)

## 2019-06-14 LAB — HIV ANTIBODY (ROUTINE TESTING W REFLEX): HIV Screen 4th Generation wRfx: NONREACTIVE

## 2019-06-14 LAB — C-REACTIVE PROTEIN: CRP: 2.7 mg/dL — ABNORMAL HIGH (ref <1.0)

## 2019-06-14 NOTE — Progress Notes (Signed)
PROGRESS NOTE                                                                                                                                                                                                             Patient Demographics:    Caitlin Davenport, is a 23 y.o. female, DOB - 07-28-1996, PIR:[id number]  Admit date - 06/13/2019   Admitting Physician Albertine Patricia, MD  Outpatient Primary MD for the patient is Patient, No Pcp Per  LOS - 1   No chief complaint on file.      Brief Narrative    23 y.o. female, with past medical history of epilepsy, hypothyroidism, patient presents to [facility] ED secondary to shortness of breath, patient was diagnosed with COVID-19 10/14, she reports generalized weakness, cough, shortness of breath, some sputum production, fever, chills, generalized body ache, no nausea, no vomiting, no diarrhea, patient had ED visit 10/19, where she was discharged home given she was not hypoxic, patient report worsening shortness of breath over last few days, patient mother is hospitalized here at [facility] for COVID-19 for pneumonia as well. -At [facility] ED patient was noted to be hypoxic 87% on room air, requiring 2 L nasal cannula, chest x-ray significant for bilateral opacity, initially tachycardic at 140, then this improved after she received steroids and started on oxygen, D-dimers were elevated at 760 (normal is 500), ferritin elevated at 748, LDH elevated at 890, platelet count was 123K, around her baseline, CRP elevated at 40, (normal is 10), she had negative urine pregnancy test, procalcitonin was normal, she was given IV steroids and transferred to [facility] for further management.    Subjective:    Arleene Settle today report dyspnea has improved, reports some cough, denies nausea, chest pain, fever or chills .   Assessment  & Plan :    Active Problems:   Hypothyroidism   Seizure ([place])  Acute respiratory disease due to COVID-19 virus   Acute hypoxic respiratory failure due to COVID-19 for pneumonia - Patient hypoxic on room air 87% on room air on presentation at [facility] ED, requiring 2 L nasal cannula. - chest x-ray significant for bilateral opacities -Continue with IV Decadron. -Continue with IV remdesivir x5 days -Continue to trend inflammatory markers closely, CRP appears to be trending down which is reassuring(as compared to [facility]  ED results). -She was encouraged again to use incentive spirometry, and to get out of bed to chair, and to prone once back in bed. -Continue with zinc and vitamin C  COVID-19 Labs  Recent Labs    06/14/19 0110  DDIMER 0.66*  CRP 2.7*    No results found for: SARSCOV2NAA  History of seizures -Continue with home dose Keppra  Hypothyroidism -Continue with Synthroid  -Patient appears to be anxious today, report she was told [facility], and she noted her pulse be in the mid 30s overnight on the continuous pulse oximetry, I told her pulse oximetry would be the accurate, but given her anxiety I will repeat EKG, and monitor her on telemetry overnight to reassure her.   Code Status : Full  Family Communication  : D/W patient  Disposition Plan  : home  Barriers For Discharge : IV steroids and IV remdesivir  Consults  :  None  Procedures  : None  DVT Prophylaxis  : [place] lovenox  Lab Results  Component Value Date   PLT 156 06/14/2019    Antibiotics  :    Anti-infectives (From admission, onward)   Start     Dose/Rate Route Frequency Ordered Stop   06/14/19 1600  remdesivir 100 mg in sodium chloride 0.9 % 250 mL IVPB     100 mg 500 mL/hr over 30 Minutes Intravenous Every 24 hours 06/13/19 1554 06/18/19 1559   06/13/19 1800  remdesivir 200 mg in sodium chloride 0.9 % 250 mL IVPB     200 mg 500 mL/hr over 30 Minutes Intravenous Once 06/13/19 1554 06/13/19 1834        Objective:    Vitals:   06/13/19 1931 06/14/19 0459 06/14/19 0509 06/14/19 0727  BP: 117/80 117/81  125/76  Pulse: 69 60 64 60  Resp: 19 18 18 20   Temp: 98.9 F (37.2 C) 98.7 F (37.1 C)  (!) 97.5 F (36.4 C)  TempSrc: Oral Oral  Oral  SpO2: 98% 97% 98% 97%  Weight:      Height:        Wt Readings from Last 3 Encounters:  06/13/19 84.6 kg  02/14/18 95.3 kg     Intake/Output Summary (Last 24 hours) at 06/14/2019 1136 Last data filed at 06/14/2019 1036 Gross per 24 hour  Intake 730 ml  Output -  Net 730 ml     Physical Exam  Awake Alert, Oriented X 3, No new F.N deficits, Normal affect Symmetrical Chest wall movement, Good air movement bilaterally, CTAB RRR,No Gallops,Rubs or new Murmurs, No Parasternal Heave +ve B.Sounds, Abd Soft, No tenderness, No rebound - guarding or rigidity. No Cyanosis, Clubbing or edema, No new Rash or bruise    Patient was seen and examined with the presence of female chaperone CNA Nellie    Data Review:    CBC Recent Labs  Lab 06/14/19 0110  WBC 2.2*  HGB 15.0  HCT 44.9  PLT 156  MCV 88.7  MCH 29.6  MCHC 33.4  RDW 12.4  LYMPHSABS 0.7  MONOABS 0.1  EOSABS 0.0  BASOSABS 0.0    Chemistries  Recent Labs  Lab 06/14/19 0110  NA 142  K 4.1  CL 108  CO2 22  GLUCOSE 155*  BUN 8  CREATININE 0.64  CALCIUM 8.9  AST 43*  ALT 64*  ALKPHOS 68  BILITOT 0.7   ------------------------------------------------------------------------------------------------------------------ No results for input(s): CHOL, HDL, LDLCALC, TRIG, CHOLHDL, LDLDIRECT in the last  72 hours.  No results found for: HGBA1C ------------------------------------------------------------------------------------------------------------------ No results for input(s): TSH, T4TOTAL, T3FREE, THYROIDAB in the last 72 hours.  Invalid input(s): FREET3 ------------------------------------------------------------------------------------------------------------------ No results  for input(s): VITAMINB12, FOLATE, FERRITIN, TIBC, IRON, RETICCTPCT in the last 72 hours.  Coagulation profile No results for input(s): INR, PROTIME in the last 168 hours.  Recent Labs    06/14/19 0110  DDIMER 0.66*    Cardiac Enzymes No results for input(s): CKMB, TROPONINI, MYOGLOBIN in the last 168 hours.  Invalid input(s): CK ------------------------------------------------------------------------------------------------------------------ No results found for: BNP  Inpatient Medications  Scheduled Meds: . dexamethasone  6 mg Oral Q24H  . docusate sodium  100 mg Oral BID  . enoxaparin (LOVENOX) injection  40 mg Subcutaneous Q24H  . famotidine  20 mg Oral BID  . levETIRAcetam  1,000 mg Oral QHS  . levothyroxine  75 mcg Oral QHS  . vitamin C  500 mg Oral Daily  . Vitamin D (Ergocalciferol)  50,000 Units Oral Q Fri  . zinc sulfate  220 mg Oral Daily   Continuous Infusions: . remdesivir 100 mg in NS 250 mL     PRN Meds:.acetaminophen, chlorpheniramine-HYDROcodone, guaiFENesin-dextromethorphan, ondansetron **OR** ondansetron (ZOFRAN) IV  Micro Results No results found for this or any previous visit (from the past 240 hour(s)).  Radiology Reports No results found.    Huey Bienenstock M.D on 06/14/2019 at 11:36 AM  Between 7am to 7pm - Pager - [phone]  After 7pm go to [email or web address] - password [facility]  [facility] -  Office  [phone]

## 2019-06-15 LAB — COMPREHENSIVE METABOLIC PANEL
ALT: 62 U/L — ABNORMAL HIGH (ref 0–44)
AST: 39 U/L (ref 15–41)
Albumin: 3.6 g/dL (ref 3.5–5.0)
Alkaline Phosphatase: 66 U/L (ref 38–126)
Anion gap: 10 (ref 5–15)
BUN: 14 mg/dL (ref 6–20)
CO2: 23 mmol/L (ref 22–32)
Calcium: 8.6 mg/dL — ABNORMAL LOW (ref 8.9–10.3)
Chloride: 107 mmol/L (ref 98–111)
Creatinine, Ser: 0.61 mg/dL (ref 0.44–1.00)
GFR calc Af Amer: >60 mL/min (ref >60)
GFR calc non Af Amer: >60 mL/min (ref >60)
Glucose, Bld: 144 mg/dL — ABNORMAL HIGH (ref 70–99)
Potassium: 4.2 mmol/L (ref 3.5–5.1)
Sodium: 140 mmol/L (ref 135–145)
Total Bilirubin: 0.5 mg/dL (ref 0.3–1.2)
Total Protein: 6.9 g/dL (ref 6.5–8.1)

## 2019-06-15 LAB — CBC WITH DIFFERENTIAL/PLATELET
Abs Immature Granulocytes: 0.03 10*3/uL (ref 0.00–0.07)
Basophils Absolute: 0 10*3/uL (ref 0.0–0.1)
Basophils Relative: 0 %
Eosinophils Absolute: 0 10*3/uL (ref 0.0–0.5)
Eosinophils Relative: 0 %
HCT: 42.8 % (ref 36.0–46.0)
Hemoglobin: 13.8 g/dL (ref 12.0–15.0)
Immature Granulocytes: 0 %
Lymphocytes Relative: 15 %
Lymphs Abs: 1.2 10*3/uL (ref 0.7–4.0)
MCH: 28.7 pg (ref 26.0–34.0)
MCHC: 32.2 g/dL (ref 30.0–36.0)
MCV: 89 fL (ref 80.0–100.0)
Monocytes Absolute: 0.4 10*3/uL (ref 0.1–1.0)
Monocytes Relative: 5 %
Neutro Abs: 6.1 10*3/uL (ref 1.7–7.7)
Neutrophils Relative %: 80 %
Platelets: 215 10*3/uL (ref 150–400)
RBC: 4.81 MIL/uL (ref 3.87–5.11)
RDW: 12.3 % (ref 11.5–15.5)
WBC: 7.6 10*3/uL (ref 4.0–10.5)
nRBC: 0 % (ref 0.0–0.2)

## 2019-06-15 LAB — D-DIMER, QUANTITATIVE: D-Dimer, Quant: 0.3 ug/mL-FEU (ref 0.00–0.50)

## 2019-06-15 LAB — C-REACTIVE PROTEIN: CRP: 0.8 mg/dL (ref <1.0)

## 2019-06-15 NOTE — Plan of Care (Signed)

## 2019-06-15 NOTE — Progress Notes (Signed)
PROGRESS NOTE                                                                                                                                                                                                             Patient Demographics:    Caitlin Davenport, is a 23 y.o. female, DOB - 10-02-1995, ZOX:[id number]  Admit date - 06/13/2019   Admitting Physician Starleen Armsawood S Jordi Kamm, MD  Outpatient Primary MD for the patient is Patient, No Pcp Per  LOS - 2   No chief complaint on file.      Brief Narrative    23 y.o. female, with past medical history of epilepsy, hypothyroidism, [facility] secondary to shortness of breath, patient was diagnosed with COVID-19 10/14, patient was noted to be hypoxic 87% on room air [facility], [facility].   Subjective:    Caitlin BuergerRachel Flammia today report dyspnea has improved, for some cough, no nausea, no vomiting, no chest pain.   Assessment  & Plan :    Active Problems:   Hypothyroidism   Seizure (HCC)   Acute respiratory disease due to COVID-19 virus   Acute hypoxic respiratory failure due to COVID-19 for pneumonia - Patient hypoxic on room air 87% on room air on [facility], requiring 2 L nasal cannula, hypoxia has improved, she is currently on room air. - chest x-ray significant for bilateral opacities -Continue with IV Decadron. -Continue with IV remdesivir x5 days -Continue to trend inflammatory markers closely, CRP has normalized which is reassuring. -She was encouraged again to use incentive spirometry, and to get out of bed to chair, and to prone once back in bed. -Continue with zinc and vitamin C  COVID-19 Labs  Recent Labs    06/14/19 0110 06/15/19 0642  DDIMER 0.66* 0.30  CRP 2.7* 0.8    No results found for: SARSCOV2NAA  History of seizures -Continue with home dose Keppra  Hypothyroidism -Continue with Synthroid   -Patient report she was told her EKG was not [facility], and she noted her pulse be in the mid 30s overnight on the continuous pulse oximetry, repeat EKG with no acute findings, she was monitored overnight on telemetry with no evidence of bradycardia, will DC telemetry today.   Code Status : Full  Family Communication  : D/W patient  Disposition Plan  : home  Barriers For Discharge : IV steroids and IV remdesivir  Consults  :  None  Procedures  : None  DVT Prophylaxis  : [place] lovenox  Lab Results  Component Value Date   PLT 215 06/15/2019    Antibiotics  :    Anti-infectives (From admission, onward)   Start     Dose/Rate Route Frequency Ordered Stop   06/14/19 1600  remdesivir 100 mg in sodium chloride 0.9 % 250 mL IVPB     100 mg 500 mL/hr over 30 Minutes Intravenous Every 24 hours 06/13/19 1554 06/18/19 1559   06/13/19 1800  remdesivir 200 mg in sodium chloride 0.9 % 250 mL IVPB     200 mg 500 mL/hr over 30 Minutes Intravenous Once 06/13/19 1554 06/13/19 1834        Objective:   Vitals:   06/14/19 1521 06/14/19 2049 06/15/19 0014 06/15/19 0844  BP: (!) 120/93 104/71 110/73 105/65  Pulse: 80 76 (!) 56 60  Resp: 18 18 16 18   Temp: 97.7 F (36.5 C) 97.8 F (36.6 C) 98.4 F (36.9 C) 98.1 F (36.7 C)  TempSrc: Oral Oral Oral Oral  SpO2: 95% 96% 98% 97%  Weight:      Height:        Wt Readings from Last 3 Encounters:  06/13/19 84.6 kg  02/14/18 95.3 kg     Intake/Output Summary (Last 24 hours) at 06/15/2019 1145 Last data filed at 06/14/2019 2051 Gross per 24 hour  Intake 850 ml  Output -  Net 850 ml     Physical Exam  Awake Alert, Oriented X 3, No new F.N deficits, Normal affect Symmetrical Chest wall movement, Good air movement bilaterally, CTAB RRR,No Gallops,Rubs or new Murmurs, No Parasternal Heave +ve B.Sounds, Abd Soft, No tenderness, No rebound - guarding or rigidity. No Cyanosis, Clubbing or edema, No new Rash or bruise      Patient was seen and examined with the presence of female chaperone RN [place]    Data Review:    CBC Recent Labs  Lab 06/14/19 0110 06/15/19 0642  WBC 2.2* 7.6  HGB 15.0 13.8  HCT 44.9 42.8  PLT 156 215  MCV 88.7 89.0  MCH 29.6 28.7  MCHC 33.4 32.2  RDW 12.4 12.3  LYMPHSABS 0.7 1.2  MONOABS 0.1 0.4  EOSABS 0.0 0.0  BASOSABS 0.0 0.0    Chemistries  Recent Labs  Lab 06/14/19 0110 06/15/19 0642  NA 142 140  K 4.1 4.2  CL 108 107  CO2 22 23  GLUCOSE 155* 144*  BUN 8 14  CREATININE 0.64 0.61  CALCIUM 8.9 8.6*  AST 43* 39  ALT 64* 62*  ALKPHOS 68 66  BILITOT 0.7 0.5   ------------------------------------------------------------------------------------------------------------------ No results for input(s): CHOL, HDL, LDLCALC, TRIG, CHOLHDL, LDLDIRECT in the last 72 hours.  No results found for: HGBA1C ------------------------------------------------------------------------------------------------------------------ No results for input(s): TSH, T4TOTAL, T3FREE, THYROIDAB in the last 72 hours.  Invalid input(s): FREET3 ------------------------------------------------------------------------------------------------------------------ No results for input(s): VITAMINB12, FOLATE, FERRITIN, TIBC, IRON, RETICCTPCT in the last 72 hours.  Coagulation profile No results for input(s): INR, PROTIME in the last 168 hours.  Recent Labs    06/14/19 0110 06/15/19 0642  DDIMER 0.66* 0.30    Cardiac Enzymes No results for input(s): CKMB, TROPONINI, MYOGLOBIN in the last 168 hours.  Invalid input(s): CK ------------------------------------------------------------------------------------------------------------------ No results found for: BNP  Inpatient Medications  Scheduled Meds: . dexamethasone  6 mg Oral Q24H  . docusate sodium  100 mg Oral BID  . enoxaparin (LOVENOX) injection  40 mg Subcutaneous Q24H  . famotidine  20 mg Oral BID  . levETIRAcetam  1,000 mg  Oral QHS  . levothyroxine  75 mcg Oral QHS  . vitamin C  500 mg Oral Daily  . Vitamin D (Ergocalciferol)  50,000 Units Oral Q Fri  . zinc sulfate  220 mg Oral Daily   Continuous Infusions: . remdesivir 100 mg in NS 250 mL 100 mg (06/14/19 1643)   PRN Meds:.acetaminophen, chlorpheniramine-HYDROcodone, guaiFENesin-dextromethorphan, ondansetron **OR** ondansetron (ZOFRAN) IV  Micro Results No results found for this or any previous visit (from the past 240 hour(s)).  Radiology Reports No results found.    Huey Bienenstock M.D on 06/15/2019 at 11:45 AM  Between 7am to 7pm - Pager - [phone]  After 7pm go to [email or web address] - password [facility]  [facility] -  Office  [phone]

## 2019-06-16 DIAGNOSIS — J069 Acute upper respiratory infection, unspecified: Secondary | ICD-10-CM

## 2019-06-16 DIAGNOSIS — U071 COVID-19: Principal | ICD-10-CM

## 2019-06-16 DIAGNOSIS — R569 Unspecified convulsions: Secondary | ICD-10-CM

## 2019-06-16 DIAGNOSIS — E039 Hypothyroidism, unspecified: Secondary | ICD-10-CM

## 2019-06-16 LAB — CBC WITH DIFFERENTIAL/PLATELET
Abs Immature Granulocytes: 0.05 10*3/uL (ref 0.00–0.07)
Basophils Absolute: 0 10*3/uL (ref 0.0–0.1)
Basophils Relative: 0 %
Eosinophils Absolute: 0 10*3/uL (ref 0.0–0.5)
Eosinophils Relative: 0 %
HCT: 43.9 % (ref 36.0–46.0)
Hemoglobin: 14.3 g/dL (ref 12.0–15.0)
Immature Granulocytes: 1 %
Lymphocytes Relative: 18 %
Lymphs Abs: 1.1 10*3/uL (ref 0.7–4.0)
MCH: 29.4 pg (ref 26.0–34.0)
MCHC: 32.6 g/dL (ref 30.0–36.0)
MCV: 90.3 fL (ref 80.0–100.0)
Monocytes Absolute: 0.3 10*3/uL (ref 0.1–1.0)
Monocytes Relative: 5 %
Neutro Abs: 4.6 10*3/uL (ref 1.7–7.7)
Neutrophils Relative %: 76 %
Platelets: 204 10*3/uL (ref 150–400)
RBC: 4.86 MIL/uL (ref 3.87–5.11)
RDW: 12.3 % (ref 11.5–15.5)
WBC: 6.1 10*3/uL (ref 4.0–10.5)
nRBC: 0 % (ref 0.0–0.2)

## 2019-06-16 LAB — COMPREHENSIVE METABOLIC PANEL
ALT: 63 U/L — ABNORMAL HIGH (ref 0–44)
AST: 28 U/L (ref 15–41)
Albumin: 3.4 g/dL — ABNORMAL LOW (ref 3.5–5.0)
Alkaline Phosphatase: 56 U/L (ref 38–126)
Anion gap: 11 (ref 5–15)
BUN: 14 mg/dL (ref 6–20)
CO2: 23 mmol/L (ref 22–32)
Calcium: 8.7 mg/dL — ABNORMAL LOW (ref 8.9–10.3)
Chloride: 107 mmol/L (ref 98–111)
Creatinine, Ser: 0.61 mg/dL (ref 0.44–1.00)
GFR calc Af Amer: >60 mL/min (ref >60)
GFR calc non Af Amer: >60 mL/min (ref >60)
Glucose, Bld: 137 mg/dL — ABNORMAL HIGH (ref 70–99)
Potassium: 4.1 mmol/L (ref 3.5–5.1)
Sodium: 141 mmol/L (ref 135–145)
Total Bilirubin: 0.5 mg/dL (ref 0.3–1.2)
Total Protein: 6.5 g/dL (ref 6.5–8.1)

## 2019-06-16 LAB — D-DIMER, QUANTITATIVE: D-Dimer, Quant: 0.27 ug/mL-FEU (ref 0.00–0.50)

## 2019-06-16 LAB — C-REACTIVE PROTEIN: CRP: <0.8 mg/dL (ref <1.0)

## 2019-06-16 NOTE — Progress Notes (Signed)
Patient was ambulated in the hallway about 300 ft. to her mother's room to visit with her. SpO2 ranged 93-97% while ambulating on room air. Her mother was updated at bedside with the patient. Neither had any further questions. Plan of care, medications, vitals, and post-hospital care discussed.

## 2019-06-16 NOTE — Plan of Care (Signed)

## 2019-06-16 NOTE — Progress Notes (Signed)
PROGRESS NOTE                                                                                                                                                                                                             Patient Demographics:    Caitlin Davenport, is a 23 y.o. female, DOB - [birth year], SPQ:[id number]  Admit date - 06/13/2019   Admitting Physician Starleen Arms, MD  Outpatient Primary MD for the patient is Patient, No Pcp Per  LOS - 3   No chief complaint on file.      Brief Narrative    23 y.o. female, with past medical history of epilepsy, hypothyroidism, patient presents to [facility] ED secondary to shortness of breath, patient was diagnosed with COVID-19 10/14, patient was noted to be hypoxic 87% on room air at [facility] ED, she was transferred to [facility] for further management.   Subjective:    Caitlin Davenport today she had a good night sleep, dyspnea has improved, compliant with prone, out of bed to chair and incentive spirometry, denies any complaints .   Assessment  & Plan :    Active Problems:   Hypothyroidism   Seizure (HCC)   Acute respiratory disease due to COVID-19 virus   Acute hypoxic respiratory failure due to COVID-19 for pneumonia - Patient hypoxic on room air 87% on room air on presentation at [facility] ED, requiring 2 L nasal cannula, hypoxia has improved, she is currently on room air. - chest x-ray significant for bilateral opacities -Continue with IV Decadron. -Continue with IV remdesivir x5 days, tomorrow will be the last dose -Continue to trend inflammatory markers closely, CRP has normalized which is reassuring. -She was encouraged again to use incentive spirometry, and to get out of bed to chair, and to prone once back in bed. -Continue with zinc and vitamin C  COVID-19 Labs  Recent Labs    06/14/19 0110 06/15/19 0642 06/16/19 0429  DDIMER 0.66* 0.30 0.27  CRP 2.7* 0.8 <0.8    No results found for:  SARSCOV2NAA  History of seizures -Continue with home dose Keppra  Hypothyroidism -Continue with Synthroid    Code Status : Full  Family Communication  : D/W patient  Disposition Plan  : home  Barriers For Discharge : IV steroids and IV remdesivir  Consults  :  None  Procedures  : None  DVT Prophylaxis  : [place] lovenox  Lab Results  Component Value Date   PLT 204 06/16/2019    Antibiotics  :    Anti-infectives (From admission, onward)   Start     Dose/Rate Route Frequency Ordered Stop   06/14/19 1600  remdesivir 100 mg in sodium chloride 0.9 % 250 mL IVPB     100 mg 500 mL/hr over 30 Minutes Intravenous Every 24 hours 06/13/19 1554 06/18/19 1559   06/13/19 1800  remdesivir 200 mg in sodium chloride 0.9 % 250 mL IVPB     200 mg 500 mL/hr over 30 Minutes Intravenous Once 06/13/19 1554 06/13/19 1834        Objective:   Vitals:   06/15/19 1508 06/15/19 1948 06/16/19 0355 06/16/19 0706  BP: 116/82 124/88 102/69 106/67  Pulse: 61 94 (!) 46 (!) 46  Resp: 18 18 16 18   Temp: 98.2 F (36.8 C) 98 F (36.7 C) 97.7 F (36.5 C) 97.6 F (36.4 C)  TempSrc: Oral Oral Oral Oral  SpO2: 97% 100% 97% 97%  Weight:      Height:        Wt Readings from Last 3 Encounters:  06/13/19 84.6 kg  02/14/18 95.3 kg     Intake/Output Summary (Last 24 hours) at 06/16/2019 0958 Last data filed at 06/15/2019 2100 Gross per 24 hour  Intake 600 ml  Output 1 ml  Net 599 ml     Physical Exam  Awake Alert, Oriented X 3, No new F.N deficits, Normal affect Symmetrical Chest wall movement, Good air movement bilaterally, CTAB RRR,No Gallops,Rubs or new Murmurs, No Parasternal Heave +ve B.Sounds, Abd Soft, No tenderness, No rebound - guarding or rigidity. No Cyanosis, Clubbing or edema, No new Rash or bruise      Patient was seen and examined with the presence of female chaperone NT Caitlin Davenport    Data Review:    CBC Recent Labs  Lab 06/14/19 0110 06/15/19 0642 06/16/19  0429  WBC 2.2* 7.6 6.1  HGB 15.0 13.8 14.3  HCT 44.9 42.8 43.9  PLT 156 215 204  MCV 88.7 89.0 90.3  MCH 29.6 28.7 29.4  MCHC 33.4 32.2 32.6  RDW 12.4 12.3 12.3  LYMPHSABS 0.7 1.2 1.1  MONOABS 0.1 0.4 0.3  EOSABS 0.0 0.0 0.0  BASOSABS 0.0 0.0 0.0    Chemistries  Recent Labs  Lab 06/14/19 0110 06/15/19 0642 06/16/19 0429  NA 142 140 141  K 4.1 4.2 4.1  CL 108 107 107  CO2 22 23 23   GLUCOSE 155* 144* 137*  BUN 8 14 14   CREATININE 0.64 0.61 0.61  CALCIUM 8.9 8.6* 8.7*  AST 43* 39 28  ALT 64* 62* 63*  ALKPHOS 68 66 56  BILITOT 0.7 0.5 0.5   ------------------------------------------------------------------------------------------------------------------ No results for input(s): CHOL, HDL, LDLCALC, TRIG, CHOLHDL, LDLDIRECT in the last 72 hours.  No results found for: HGBA1C ------------------------------------------------------------------------------------------------------------------ No results for input(s): TSH, T4TOTAL, T3FREE, THYROIDAB in the last 72 hours.  Invalid input(s): FREET3 ------------------------------------------------------------------------------------------------------------------ No results for input(s): VITAMINB12, FOLATE, FERRITIN, TIBC, IRON, RETICCTPCT in the last 72 hours.  Coagulation profile No results for input(s): INR, PROTIME in the last 168 hours.  Recent Labs    06/15/19 0642 06/16/19 0429  DDIMER 0.30 0.27    Cardiac Enzymes No results for input(s): CKMB, TROPONINI, MYOGLOBIN in the last 168 hours.  Invalid input(s): CK ------------------------------------------------------------------------------------------------------------------ No results found for: BNP  Inpatient Medications  Scheduled Meds: .  dexamethasone  6 mg Oral Q24H  . docusate sodium  100 mg Oral BID  . enoxaparin (LOVENOX) injection  40 mg Subcutaneous Q24H  . famotidine  20 mg Oral BID  . levETIRAcetam  1,000 mg Oral QHS  . levothyroxine  75 mcg Oral  QHS  . vitamin C  500 mg Oral Daily  . Vitamin D (Ergocalciferol)  50,000 Units Oral Q Fri  . zinc sulfate  220 mg Oral Daily   Continuous Infusions: . remdesivir 100 mg in NS 250 mL 100 mg (06/15/19 1705)   PRN Meds:.acetaminophen, chlorpheniramine-HYDROcodone, guaiFENesin-dextromethorphan, ondansetron **OR** ondansetron (ZOFRAN) IV  Micro Results No results found for this or any previous visit (from the past 240 hour(s)).  Radiology Reports No results found.    Phillips Climes M.D on 06/16/2019 at 9:58 AM  Between 7am to 7pm - Pager - [phone]  After 7pm go to [email or web address] - password [facility]  [facility] -  Office  [phone]

## 2019-06-17 DIAGNOSIS — U071 COVID-19: Principal | ICD-10-CM

## 2019-06-17 DIAGNOSIS — R569 Unspecified convulsions: Secondary | ICD-10-CM

## 2019-06-17 DIAGNOSIS — E039 Hypothyroidism, unspecified: Secondary | ICD-10-CM

## 2019-06-17 DIAGNOSIS — J069 Acute upper respiratory infection, unspecified: Secondary | ICD-10-CM

## 2019-06-17 LAB — COMPREHENSIVE METABOLIC PANEL
ALT: 57 U/L — ABNORMAL HIGH (ref 0–44)
AST: 25 U/L (ref 15–41)
Albumin: 3.4 g/dL — ABNORMAL LOW (ref 3.5–5.0)
Alkaline Phosphatase: 61 U/L (ref 38–126)
Anion gap: 8 (ref 5–15)
BUN: 15 mg/dL (ref 6–20)
CO2: 24 mmol/L (ref 22–32)
Calcium: 8.3 mg/dL — ABNORMAL LOW (ref 8.9–10.3)
Chloride: 107 mmol/L (ref 98–111)
Creatinine, Ser: 0.74 mg/dL (ref 0.44–1.00)
GFR calc Af Amer: >60 mL/min (ref >60)
GFR calc non Af Amer: >60 mL/min (ref >60)
Glucose, Bld: 182 mg/dL — ABNORMAL HIGH (ref 70–99)
Potassium: 4.1 mmol/L (ref 3.5–5.1)
Sodium: 139 mmol/L (ref 135–145)
Total Bilirubin: 0.4 mg/dL (ref 0.3–1.2)
Total Protein: 6.5 g/dL (ref 6.5–8.1)

## 2019-06-17 LAB — CBC WITH DIFFERENTIAL/PLATELET
Abs Immature Granulocytes: 0.09 10*3/uL — ABNORMAL HIGH (ref 0.00–0.07)
Basophils Absolute: 0 10*3/uL (ref 0.0–0.1)
Basophils Relative: 0 %
Eosinophils Absolute: 0 10*3/uL (ref 0.0–0.5)
Eosinophils Relative: 0 %
HCT: 44.7 % (ref 36.0–46.0)
Hemoglobin: 14.2 g/dL (ref 12.0–15.0)
Immature Granulocytes: 1 %
Lymphocytes Relative: 17 %
Lymphs Abs: 1.3 10*3/uL (ref 0.7–4.0)
MCH: 28.7 pg (ref 26.0–34.0)
MCHC: 31.8 g/dL (ref 30.0–36.0)
MCV: 90.3 fL (ref 80.0–100.0)
Monocytes Absolute: 0.4 10*3/uL (ref 0.1–1.0)
Monocytes Relative: 5 %
Neutro Abs: 6.1 10*3/uL (ref 1.7–7.7)
Neutrophils Relative %: 77 %
Platelets: 227 10*3/uL (ref 150–400)
RBC: 4.95 MIL/uL (ref 3.87–5.11)
RDW: 12.4 % (ref 11.5–15.5)
WBC: 7.9 10*3/uL (ref 4.0–10.5)
nRBC: 0 % (ref 0.0–0.2)

## 2019-06-17 LAB — C-REACTIVE PROTEIN: CRP: 0.8 mg/dL (ref <1.0)

## 2019-06-17 LAB — D-DIMER, QUANTITATIVE: D-Dimer, Quant: 0.27 ug/mL-FEU (ref 0.00–0.50)

## 2019-06-17 MED ORDER — DEXAMETHASONE 6 MG PO TABS: 6.0000 mg | ORAL_TABLET | ORAL | 0 refills | Status: DC

## 2019-06-17 MED ORDER — SODIUM CHLORIDE 0.9 % IV SOLN
100.0000 mg | INTRAVENOUS | Status: AC
  Administered 2019-06-17: 100 mg via INTRAVENOUS
  Filled 2019-06-17: qty 20

## 2019-06-17 MED ORDER — FAMOTIDINE 20 MG PO TABS: 20.0000 mg | ORAL_TABLET | Freq: Two times a day (BID) | ORAL | 0 refills | Status: DC

## 2019-06-17 MED ORDER — ACETAMINOPHEN 325 MG PO TABS: 650.0000 mg | ORAL_TABLET | Freq: Four times a day (QID) | ORAL | 0 refills | Status: DC | PRN

## 2019-06-17 NOTE — Plan of Care (Signed)
  Problem: Education: Goal: Knowledge of General Education information will improve Description: Including pain rating scale, medication(s)/side effects and non-pharmacologic comfort measures 06/17/2019 1633 by Herma Ard, RN Outcome: Completed/Met 06/17/2019 0754 by Herma Ard, RN Outcome: Progressing   Problem: Health Behavior/Discharge Planning: Goal: Ability to manage health-related needs will improve 06/17/2019 1633 by Herma Ard, RN Outcome: Completed/Met 06/17/2019 0754 by Herma Ard, RN Outcome: Progressing   Problem: Clinical Measurements: Goal: Ability to maintain clinical measurements within normal limits will improve 06/17/2019 1633 by Herma Ard, RN Outcome: Completed/Met 06/17/2019 0754 by Herma Ard, RN Outcome: Progressing Goal: Will remain free from infection 06/17/2019 1633 by Herma Ard, RN Outcome: Completed/Met 06/17/2019 0754 by Herma Ard, RN Outcome: Progressing Goal: Diagnostic test results will improve 06/17/2019 1633 by Herma Ard, RN Outcome: Completed/Met 06/17/2019 0754 by Herma Ard, RN Outcome: Progressing Goal: Respiratory complications will improve 06/17/2019 1633 by Herma Ard, RN Outcome: Completed/Met 06/17/2019 0754 by Herma Ard, RN Outcome: Progressing Goal: Cardiovascular complication will be avoided 06/17/2019 1633 by Herma Ard, RN Outcome: Completed/Met 06/17/2019 0754 by Herma Ard, RN Outcome: Progressing   Problem: Activity: Goal: Risk for activity intolerance will decrease 06/17/2019 1633 by Herma Ard, RN Outcome: Completed/Met 06/17/2019 0754 by Herma Ard, RN Outcome: Progressing   Problem: Nutrition: Goal: Adequate nutrition will be maintained 06/17/2019 1633 by Herma Ard, RN Outcome: Completed/Met 06/17/2019 0754 by Herma Ard, RN Outcome: Progressing   Problem: Coping: Goal: Level of anxiety will  decrease 06/17/2019 1633 by Herma Ard, RN Outcome: Completed/Met 06/17/2019 0754 by Herma Ard, RN Outcome: Progressing   Problem: Elimination: Goal: Will not experience complications related to bowel motility 06/17/2019 1633 by Herma Ard, RN Outcome: Completed/Met 06/17/2019 0754 by Herma Ard, RN Outcome: Progressing Goal: Will not experience complications related to urinary retention 06/17/2019 1633 by Herma Ard, RN Outcome: Completed/Met 06/17/2019 0754 by Herma Ard, RN Outcome: Progressing   Problem: Pain Managment: Goal: General experience of comfort will improve 06/17/2019 1633 by Herma Ard, RN Outcome: Completed/Met 06/17/2019 0754 by Herma Ard, RN Outcome: Progressing   Problem: Safety: Goal: Ability to remain free from injury will improve 06/17/2019 1633 by Herma Ard, RN Outcome: Completed/Met 06/17/2019 0754 by Herma Ard, RN Outcome: Progressing   Problem: Skin Integrity: Goal: Risk for impaired skin integrity will decrease 06/17/2019 1633 by Herma Ard, RN Outcome: Completed/Met 06/17/2019 0754 by Herma Ard, RN Outcome: Progressing   Problem: Education: Goal: Knowledge of risk factors and measures for prevention of condition will improve 06/17/2019 1633 by Herma Ard, RN Outcome: Completed/Met 06/17/2019 0754 by Herma Ard, RN Outcome: Progressing   Problem: Coping: Goal: Psychosocial and spiritual needs will be supported 06/17/2019 1633 by Herma Ard, RN Outcome: Completed/Met 06/17/2019 0754 by Herma Ard, RN Outcome: Progressing   Problem: Respiratory: Goal: Will maintain a patent airway 06/17/2019 1633 by Herma Ard, RN Outcome: Completed/Met 06/17/2019 0754 by Herma Ard, RN Outcome: Progressing Goal: Complications related to the disease process, condition or treatment will be avoided or minimized 06/17/2019 1633 by Herma Ard, RN Outcome: Completed/Met 06/17/2019 0754 by Herma Ard, RN Outcome: Progressing

## 2019-06-17 NOTE — Plan of Care (Signed)

## 2019-06-17 NOTE — Progress Notes (Addendum)
Assumed care of this pt at this time (2343).  SBAR report received from Lonzo Cloud, RN.

## 2019-06-17 NOTE — Discharge Instructions (Signed)
Person Under Monitoring Name: Caitlin Davenport  Location: [place] [address]   Infection Prevention Recommendations for Individuals Confirmed to have, or Being Evaluated for, 2019 Novel Coronavirus (COVID-19) Infection Who Receive Care at Home  Individuals who are confirmed to have, or are being evaluated for, COVID-19 should follow the prevention steps below until a healthcare provider or local or state health department says they can return to normal activities.  Stay home except to get medical care You should restrict activities outside your home, except for getting medical care. Do not go to work, school, or public areas, and do not use public transportation or taxis.  Call ahead before visiting your doctor Before your medical appointment, call the healthcare provider and tell them that you have, or are being evaluated for, COVID-19 infection. This will help the healthcare providers office take steps to keep other people from getting infected. Ask your healthcare provider to call the local or state health department.  Monitor your symptoms Seek prompt medical attention if your illness is worsening (e.g., difficulty breathing). Before going to your medical appointment, call the healthcare provider and tell them that you have, or are being evaluated for, COVID-19 infection. Ask your healthcare provider to call the local or state health department.  Wear a facemask You should wear a facemask that covers your nose and mouth when you are in the same room with other people and when you visit a healthcare provider. People who live with or visit you should also wear a facemask while they are in the same room with you.  Separate yourself from other people in your home As much as possible, you should stay in a different room from other people in your home. Also, you should use a separate bathroom, if available.  Avoid sharing household items You should not  share dishes, drinking glasses, cups, eating utensils, towels, bedding, or other items with other people in your home. After using these items, you should wash them thoroughly with soap and water.  Cover your coughs and sneezes Cover your mouth and nose with a tissue when you cough or sneeze, or you can cough or sneeze into your sleeve. Throw used tissues in a lined trash can, and immediately wash your hands with soap and water for at least 20 seconds or use an alcohol-based hand rub.  Wash your [facility] your hands often and thoroughly with soap and water for at least 20 seconds. You can use an alcohol-based hand sanitizer if soap and water are not available and if your hands are not visibly dirty. Avoid touching your eyes, nose, and mouth with unwashed hands.   Prevention Steps for Caregivers and Household Members of Individuals Confirmed to have, or Being Evaluated for, COVID-19 Infection Being Cared for in the Home  If you live with, or provide care at home for, a person confirmed to have, or being evaluated for, COVID-19 infection please follow these guidelines to prevent infection:  Follow healthcare providers instructions Make sure that you understand and can help the patient follow any healthcare provider instructions for all care.  Provide for the patients basic needs You should help the patient with basic needs in the home and provide support for getting groceries, prescriptions, and other personal needs.  Monitor the patients symptoms If they are getting sicker, call his or her medical provider and tell them that the patient has, or is being evaluated for, COVID-19 infection. This will help the healthcare providers  office take steps to keep other people from getting infected. Ask the healthcare provider to call the local or state health department.  Limit the number of people who have contact with the patient  If possible, have only one caregiver for the  patient.  Other household members should stay in another home or place of residence. If this is not possible, they should stay  in another room, or be separated from the patient as much as possible. Use a separate bathroom, if available.  Restrict visitors who do not have an essential need to be in the home.  Keep older adults, very young children, and other sick people away from the patient Keep older adults, very young children, and those who have compromised immune systems or chronic health conditions away from the patient. This includes people with chronic heart, lung, or kidney conditions, diabetes, and cancer.  Ensure good ventilation Make sure that shared spaces in the home have good air flow, such as from an air conditioner or an opened window, weather permitting.  Wash your hands often  Wash your hands often and thoroughly with soap and water for at least 20 seconds. You can use an alcohol based hand sanitizer if soap and water are not available and if your hands are not visibly dirty.  Avoid touching your eyes, nose, and mouth with unwashed hands.  Use disposable paper towels to dry your hands. If not available, use dedicated cloth towels and replace them when they become wet.  Wear a facemask and gloves  Wear a disposable facemask at all times in the room and gloves when you touch or have contact with the patients blood, body fluids, and/or secretions or excretions, such as sweat, saliva, sputum, nasal mucus, vomit, urine, or feces.  Ensure the mask fits over your nose and mouth tightly, and do not touch it during use.  Throw out disposable facemasks and gloves after using them. Do not reuse.  Wash your hands immediately after removing your facemask and gloves.  If your personal clothing becomes contaminated, carefully remove clothing and launder. Wash your hands after handling contaminated clothing.  Place all used disposable facemasks, gloves, and other waste in a lined  container before disposing them with other household waste.  Remove gloves and wash your hands immediately after handling these items.  Do not share dishes, glasses, or other household items with the patient  Avoid sharing household items. You should not share dishes, drinking glasses, cups, eating utensils, towels, bedding, or other items with a patient who is confirmed to have, or being evaluated for, COVID-19 infection.  After the person uses these items, you should wash them thoroughly with soap and water.  Wash laundry thoroughly  Immediately remove and wash clothes or bedding that have blood, body fluids, and/or secretions or excretions, such as sweat, saliva, sputum, nasal mucus, vomit, urine, or feces, on them.  Wear gloves when handling laundry from the patient.  Read and follow directions on labels of laundry or clothing items and detergent. In general, wash and dry with the warmest temperatures recommended on the label.  Clean all areas the individual has used often  Clean all touchable surfaces, such as counters, tabletops, doorknobs, bathroom fixtures, toilets, phones, keyboards, tablets, and bedside tables, every day. Also, clean any surfaces that may have blood, body fluids, and/or secretions or excretions on them.  Wear gloves when cleaning surfaces the patient has come in contact with.  Use a diluted bleach solution (e.g., dilute bleach with 1  part bleach and 10 parts water) or a household disinfectant with a label that says EPA-registered for coronaviruses. To make a bleach solution at home, add 1 tablespoon of bleach to 1 quart (4 cups) of water. For a larger supply, add  cup of bleach to 1 gallon (16 cups) of water.  Read labels of cleaning products and follow recommendations provided on product labels. Labels contain instructions for safe and effective use of the cleaning product including precautions you should take when applying the product, such as wearing gloves or  eye protection and making sure you have good ventilation during use of the product.  Remove gloves and wash hands immediately after cleaning.  Monitor yourself for signs and symptoms of illness Caregivers and household members are considered close contacts, should monitor their health, and will be asked to limit movement outside of the home to the extent possible. Follow the monitoring steps for close contacts listed on the symptom monitoring form.   ? If you have additional questions, contact your local health department or call the epidemiologist on call at [phone] (available 24/7). ? This guidance is subject to change. For the most up-to-date guidance from [facility], please refer to their website: YouBlogs.pl

## 2019-06-17 NOTE — Discharge Summary (Signed)
Caitlin Davenport, is a 23 y.o. female  DOB 1995/12/25  MRN [id number].  Admission date:  06/13/2019  Admitting Physician  Albertine Patricia, MD  Discharge Date:  06/17/2019   Primary MD  Patient, No Pcp Per  Recommendations for primary care physician for things to follow:  - please check CBC, CMP during next visit   Admission Diagnosis  covid 19   Discharge Diagnosis  covid 19   Active Problems:   Hypothyroidism   Seizure ([place])   Acute respiratory disease due to COVID-19 virus      Past Medical History:  Diagnosis Date   Hypothyroid    Polycystic ovarian syndrome    Seizures ([place])     History reviewed. No pertinent surgical history.     History of present illness and  Hospital Course:     Kindly see H&P for history of present illness and admission details, please review complete Labs, Consult reports and Test reports for all details in brief  HPI  from the history and physical done on the day of admission 06/13/2019   Caitlin Davenport  is a 23 y.o. female, with past medical history of epilepsy, hypothyroidism, patient presents to [facility] ED secondary to shortness of breath, patient was diagnosed with COVID-19 10/14, she reports generalized weakness, cough, shortness of breath, some sputum production, fever, chills, generalized body ache, no nausea, no vomiting, no diarrhea, patient had ED visit 10/19, where she was discharged home given she was not hypoxic, patient report worsening shortness of breath over last few days, patient mother is hospitalized here at [facility] for COVID-19 for pneumonia as well. -At [facility] ED patient was noted to be hypoxic 87% on room air, requiring 2 L nasal cannula, chest x-ray significant for bilateral opacity, initially tachycardic at 140, then this improved after she received steroids and started on oxygen, D-dimers were elevated at 760 (normal is  500), ferritin elevated at 748, LDH elevated at 890, platelet count was 123K, around her baseline, CRP elevated at 40, (normal is 10), she had negative urine pregnancy test, procalcitonin was normal, she was given IV steroids and transferred to [facility] for further management.   Hospital Course    Acute hypoxic respiratory failure due to COVID-19 for pneumonia - Patient hypoxic on room air 87% on room air on presentation at [facility] ED, requiring 2 L nasal cannula, hypoxia has improved, she is currently on room air fir last 48 hours. chest x-ray significant for bilateral opacities, she was treated with Decadron during hospital stay, and finished total of 5 days of IV remdesivir, her D-dimers and CRP has normalized which is reassuring, denies any dyspnea, or chest pain, she will be discharged on another 5 days of oral Decadron to finish total of 10 days treatment.   COVID-19 Labs  Recent Labs (last 2 labs)        Recent Labs    06/14/19 0110 06/15/19 0642 06/16/19 0429  DDIMER 0.66* 0.30 0.27  CRP 2.7* 0.8 <0.8  History of seizures -Continue with home dose Keppra  Hypothyroidism -Continue with Synthroid      Discharge Condition:  stable   Discharge Instructions  and  Discharge Medications    Discharge Instructions    Discharge instructions   Complete by: As directed    Follow with Primary MD Patient, in 14 days   Get CBC, CMP checked  by Primary MD next visit.    Activity: As tolerated with Full fall precautions use walker/cane & assistance as needed   Disposition Home    Diet: Regular diet.  On your next visit with your primary care physician please Get Medicines reviewed and adjusted.   Please request your Prim.MD to go over all Hospital Tests and Procedure/Radiological results at the follow up, please get all Hospital records sent to your Prim MD by signing hospital release before you go home.   If you experience worsening of  your admission symptoms, develop shortness of breath, life threatening emergency, suicidal or homicidal thoughts you must seek medical attention immediately by calling 911 or calling your MD immediately  if symptoms less severe.  You Must read complete instructions/literature along with all the possible adverse reactions/side effects for all the Medicines you take and that have been prescribed to you. Take any new Medicines after you have completely understood and accpet all the possible adverse reactions/side effects.   Do not drive, operating heavy machinery, perform activities at heights, swimming or participation in water activities or provide baby sitting services if your were admitted for syncope or siezures until you have seen by Primary MD or a Neurologist and advised to do so again.  Do not drive when taking Pain medications.    Do not take more than prescribed Pain, Sleep and Anxiety Medications  Special Instructions: If you have smoked or chewed Tobacco  in the last 2 yrs please stop smoking, stop any regular Alcohol  and or any Recreational drug use.  Wear Seat belts while driving.   Please note  You were cared for by a hospitalist during your hospital stay. If you have any questions about your discharge medications or the care you received while you were in the hospital after you are discharged, you can call the unit and asked to speak with the hospitalist on call if the hospitalist that took care of you is not available. Once you are discharged, your primary care physician will handle any further medical issues. Please note that NO REFILLS for any discharge medications will be authorized once you are discharged, as it is imperative that you return to your primary care physician (or establish a relationship with a primary care physician if you do not have one) for your aftercare needs so that they can reassess your need for medications and monitor your lab values.   Increase activity  slowly   Complete by: As directed      Allergies as of 06/17/2019   No Known Allergies     Medication List    STOP taking these medications   dicyclomine 20 MG tablet Commonly known as: BENTYL   phentermine 37.5 MG tablet Commonly known as: ADIPEX-P     TAKE these medications   acetaminophen 325 MG tablet Commonly known as: Tylenol Take 2 tablets (650 mg total) by mouth every 6 (six) hours as needed. What changed: Another medication with the same name was removed. Continue taking this medication, and follow the directions you see here.   dexamethasone 6 MG tablet Commonly known as: DECADRON  Take 1 tablet (6 mg total) by mouth daily. Start taking on: June 18, 2019   famotidine 20 MG tablet Commonly known as: PEPCID Take 1 tablet (20 mg total) by mouth 2 (two) times daily.   Junel FE 1/20 1-20 MG-MCG tablet Generic drug: norethindrone-ethinyl estradiol Take 1 tablet by mouth daily.   levETIRAcetam 500 MG 24 hr tablet Commonly known as: KEPPRA XR Take 1,000 mg by mouth at bedtime.   levothyroxine 75 MCG tablet Commonly known as: SYNTHROID Take 75 mcg by mouth at bedtime.   Vitamin D (Ergocalciferol) 1.25 MG (50000 UT) Caps capsule Commonly known as: DRISDOL Take 50,000 Units by mouth every Friday.         Diet and Activity recommendation: See Discharge Instructions above   Consults obtained - None   Major procedures and Radiology Reports - PLEASE review detailed and final reports for all details, in brief -      No results found.  Micro Results    No results found for this or any previous visit (from the past 240 hour(s)).     Today   Subjective:   Caitlin Davenport today has no headache,no chest or abdominal pain,no new weakness tingling or numbness, feels much better wants to go home today. No  Shortness of breath, no cough.  Objective:   Blood pressure (!) 98/57, pulse (!) 52, temperature (!) 97.2 F (36.2 C), temperature source Oral,  resp. rate 18, height 5\' 6"  (1.676 m), weight 84.6 kg, SpO2 97 %.   Intake/Output Summary (Last 24 hours) at 06/17/2019 1109 Last data filed at 06/16/2019 1647 Gross per 24 hour  Intake 250 ml  Output --  Net 250 ml    Exam Awake Alert, Oriented x 3, No new F.N deficits, Normal affect Symmetrical Chest wall movement, Good air movement bilaterally, CTAB RRR,No Gallops,Rubs or new Murmurs, No Parasternal Heave +ve B.Sounds, Abd Soft, Non tender. No Cyanosis, Clubbing or edema, No new Rash or bruise  Data Review   CBC w Diff:  Lab Results  Component Value Date   WBC 7.9 06/17/2019   HGB 14.2 06/17/2019   HCT 44.7 06/17/2019   PLT 227 06/17/2019   LYMPHOPCT 17 06/17/2019   MONOPCT 5 06/17/2019   EOSPCT 0 06/17/2019   BASOPCT 0 06/17/2019    CMP:  Lab Results  Component Value Date   NA 139 06/17/2019   K 4.1 06/17/2019   CL 107 06/17/2019   CO2 24 06/17/2019   BUN 15 06/17/2019   CREATININE 0.74 06/17/2019   PROT 6.5 06/17/2019   ALBUMIN 3.4 (L) 06/17/2019   BILITOT 0.4 06/17/2019   ALKPHOS 61 06/17/2019   AST 25 06/17/2019   ALT 57 (H) 06/17/2019  .   Total Time in preparing paper work, data evaluation and todays exam - 30 minutes  Huey Bienenstockawood Cashtyn Pouliot M.D on 06/17/2019 at 11:09 AM  [facility]   Office  [phone]

## 2019-06-17 NOTE — Progress Notes (Signed)
Patient discharge paperwork printed. Reviewed discharge instructions, medications, prescriptions, follow-up appointment, and continuation of care at home. Pt verbalized an understanding of all information. All belongings were returned to the patient including her home medications. She left the unit in stable condition at this time.

## 2019-06-17 NOTE — TOC Transition Note (Signed)
Transition of Care [facility]) - CM/SW Discharge Note   Patient Details  Name: Caitlin Davenport MRN: [id number] Date of Birth: 1996-01-05  [facility] of [facility]) CM/SW Contact:  Ninfa Meeker, RN Phone Number: 06/17/2019, 4:01 PM   Clinical Narrative:   Case manager arranged telephonic hospital follow up appointment for patient with [facility]. Appt is scheduled for Monday, 06/30/19 @ 4:10pm. Appt entered on AVS.    Final next level of care: Home/Self Care Barriers to Discharge: No Barriers Identified   Patient Goals and CMS Choice        Discharge Placement                       Discharge Plan and Services In-house Referral: NA Discharge Planning Services: CM Consult, Follow-up appt scheduled Post Acute Care Choice: NA          DME Arranged: N/A DME Agency: NA       HH Arranged: NA          Social Determinants of Health (SDOH) Interventions     Readmission Risk Interventions No flowsheet data found.

## 2019-06-30 ENCOUNTER — Encounter (INDEPENDENT_AMBULATORY_CARE_PROVIDER_SITE_OTHER): Payer: Self-pay | Admitting: Primary Care

## 2019-06-30 ENCOUNTER — Other Ambulatory Visit: Payer: Self-pay

## 2019-07-19 ENCOUNTER — Encounter (INDEPENDENT_AMBULATORY_CARE_PROVIDER_SITE_OTHER): Payer: Self-pay | Admitting: Primary Care

## 2019-10-20 ENCOUNTER — Other Ambulatory Visit: Payer: Self-pay

## 2019-10-20 ENCOUNTER — Ambulatory Visit (INDEPENDENT_AMBULATORY_CARE_PROVIDER_SITE_OTHER): Payer: BC Managed Care – PPO | Admitting: Cardiology

## 2019-10-20 ENCOUNTER — Encounter: Payer: Self-pay | Admitting: Cardiology

## 2019-10-20 DIAGNOSIS — R011 Cardiac murmur, unspecified: Secondary | ICD-10-CM

## 2019-10-20 DIAGNOSIS — R002 Palpitations: Secondary | ICD-10-CM

## 2019-10-20 DIAGNOSIS — R000 Tachycardia, unspecified: Secondary | ICD-10-CM

## 2019-10-20 HISTORY — DX: Tachycardia, unspecified: R00.0

## 2019-10-20 HISTORY — DX: Palpitations: R00.2

## 2019-10-20 HISTORY — DX: Cardiac murmur, unspecified: R01.1

## 2019-10-20 NOTE — Progress Notes (Signed)
Cardiology Office Note:    Date:  10/20/2019   ID:  Caitlin Davenport, DOB 1995-08-24, MRN [id number]  PCP:  Wilmer Floor., MD  Cardiologist:  Garwin Brothers, MD   Referring MD: Wilmer Floor., MD    ASSESSMENT:    1. Palpitations   2. Sinus tachycardia   3. Cardiac murmur    PLAN:    In order of problems listed above:  1. Palpitations: I discussed my findings with the patient extensively.  I reassured her.  She has been advised to take metoprolol in the emergency room and she did not.  She will take 12.5 mg uptitrate metoprolol twice a day.  This will help her heart rate for the next several weeks.  I will also see what her TSH looks like at the hospital.  EKG reveals sinus tachycardia nonspecific ST-T changes.  Cardiac murmur: Echocardiogram will be done to assess this. 2. I reassured the patient about my findings.  She has recovered well from the Covid.  She has no symptoms from Covid related issues. 3. She will be seen in follow-up appointment in 2 months or earlier if she has any concerns. 4. I reviewed I reviewed [facility] hospital records extensively and EKG 2.  Her TSH at the hospital was within normal limits.   Medication Adjustments/Labs and Tests Ordered: Current medicines are reviewed at length with the patient today.  Concerns regarding medicines are outlined above.  No orders of the defined types were placed in this encounter.  No orders of the defined types were placed in this encounter.    History of Present Illness:    Caitlin Davenport is a 24 y.o. female who is being seen today for the evaluation of palpitations at the request of Wilmer Floor., MD.  Patient is a pleasant 24 year old female.  She has past medical history of epilepsy.  She mentions to me that she had Covid in October.  Subsequently she is done well.  She was found to have elevated heart rate and sent here.  She has history of thyroid issues.  At the time of my evaluation, the patient is  alert awake oriented and in no distress.  No syncope no dizziness or any such problems.  Past Medical History:  Diagnosis Date  . Hypothyroid   . Polycystic ovarian syndrome   . Seizures ([facility])     History reviewed. No pertinent surgical history.  Current Medications: Current Meds  Medication Sig  . acetaminophen (TYLENOL) 325 MG tablet Take 2 tablets (650 mg total) by mouth every 6 (six) hours as needed.  Marland Kitchen ibuprofen (ADVIL) 600 MG tablet ibuprofen 600 mg tablet  . JUNEL FE 1/20 1-20 MG-MCG tablet Take 1 tablet by mouth daily.  Marland Kitchen levETIRAcetam (KEPPRA XR) 500 MG 24 hr tablet Take 1,000 mg by mouth at bedtime.  Marland Kitchen levothyroxine (SYNTHROID) 75 MCG tablet Take 75 mcg by mouth at bedtime.  Marland Kitchen LORazepam (ATIVAN) 0.5 MG tablet lorazepam 0.5 mg tablet  TAKE 1 TABLET BY MOUTH TWICE (2) DAILY AS NEEDED     Allergies:   Lubiprostone   Social History   Socioeconomic History  . Marital status: Single    Spouse name: Not on file  . Number of children: Not on file  . Years of education: Not on file  . Highest education level: Not on file  Occupational History  . Not on file  Tobacco Use  . Smoking status: Never Smoker  . Smokeless tobacco: Never Used  Substance  and Sexual Activity  . Alcohol use: Yes    Comment: occ  . Drug use: Never  . Sexual activity: Not on file  Other Topics Concern  . Not on file  Social History Narrative   Patient lives at home with her mother and her mother's boyfriend. 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    Social Determinants of Health   Financial Resource Strain:   . Difficulty of Paying Living Expenses: Not on file  Food Insecurity: No Food Insecurity  . Worried About Charity fundraiser in the Last Year: Never true  . Ran Out of Food in the Last Year: Never true  Transportation Needs: No Transportation Needs  . Lack of Transportation (Medical): No  . Lack of Transportation (Non-Medical): No  Physical Activity: Sufficiently Active    . Days of Exercise per Week: 3 days  . Minutes of Exercise per Session: 60 min  Stress: No Stress Concern Present  . Feeling of Stress : Not at all  Social Connections: Unknown  . Frequency of Communication with Friends and Family: More than three times a week  . Frequency of Social Gatherings with Friends and Family: More than three times a week  . Attends Religious Services: Patient refused  . Active Member of Clubs or Organizations: Patient refused  . Attends Archivist Meetings: Patient refused  . Marital Status: Patient refused     Family History: The patient's family history is not on file.  ROS:   Please see the history of present illness.    All other systems reviewed and are negative.  EKGs/Labs/Other Studies Reviewed:    The following studies were reviewed today: I reviewed emergency room records.   Recent Labs: 06/17/2019: ALT 57; BUN 15; Creatinine, Ser 0.74; Hemoglobin 14.2; Platelets 227; Potassium 4.1; Sodium 139  Recent Lipid Panel No results found for: CHOL, TRIG, HDL, CHOLHDL, VLDL, LDLCALC, LDLDIRECT  Physical Exam:    VS:  BP 128/90   Pulse (!) 113   Ht 5\' 6"  (1.676 m)   Wt 221 lb (100.2 kg)   SpO2 99%   BMI 35.67 kg/m     Wt Readings from Last 3 Encounters:  10/20/19 221 lb (100.2 kg)  06/13/19 186 lb 8 oz (84.6 kg)  02/14/18 210 lb (95.3 kg)     GEN: Patient is in no acute distress HEENT: Normal NECK: No JVD; No carotid bruits LYMPHATICS: No lymphadenopathy CARDIAC: S1 S2 regular, 2/6 systolic murmur at the apex. RESPIRATORY:  Clear to auscultation without rales, wheezing or rhonchi  ABDOMEN: Soft, non-tender, non-distended MUSCULOSKELETAL:  No edema; No deformity  SKIN: Warm and dry NEUROLOGIC:  Alert and oriented x 3 PSYCHIATRIC:  Normal affect    Signed, Jenean Lindau, MD  10/20/2019 3:38 PM    [facility]

## 2019-10-20 NOTE — Patient Instructions (Addendum)
Medication Instructions:  Your physician has recommended you make the following change in your medication:   Take Metoprolol 12.5 mg twice daily.  *If you need a refill on your cardiac medications before your next appointment, please call your pharmacy*   Lab Work: None ordered If you have labs (blood work) drawn today and your tests are completely normal, you will receive your results only by: Marland Kitchen MyChart Message (if you have MyChart) OR . A paper copy in the mail If you have any lab test that is abnormal or we need to change your treatment, we will call you to review the results.   Testing/Procedures: Your physician has requested that you have an echocardiogram. Echocardiography is a painless test that uses sound waves to create images of your heart. It provides your doctor with information about the size and shape of your heart and how well your heart's chambers and valves are working. This procedure takes approximately one hour. There are no restrictions for this procedure.     Follow-Up: At [facility], you and your health needs are our priority.  As part of our continuing mission to provide you with exceptional heart care, we have created designated Provider Care Teams.  These Care Teams include your primary Cardiologist (physician) and Advanced Practice Providers (APPs -  Physician Assistants and Nurse Practitioners) who all work together to provide you with the care you need, when you need it.  We recommend signing up for the patient portal called "MyChart".  Sign up information is provided on this After Visit Summary.  MyChart is used to connect with patients for Virtual Visits (Telemedicine).  Patients are able to view lab/test results, encounter notes, upcoming appointments, etc.  Non-urgent messages can be sent to your provider as well.   To learn more about what you can do with MyChart, go to [email or web address].    Your next appointment:   2 month(s)  The format for  your next appointment:   In Person  Provider:   Jyl Heinz, MD   Other Instructions  Echocardiogram An echocardiogram is a procedure that uses painless sound waves (ultrasound) to produce an image of the heart. Images from an echocardiogram can provide important information about:  Signs of coronary artery disease (CAD).  Aneurysm detection. An aneurysm is a weak or damaged part of an artery wall that bulges out from the normal force of blood pumping through the body.  Heart size and shape. Changes in the size or shape of the heart can be associated with certain conditions, including heart failure, aneurysm, and CAD.  Heart muscle function.  Heart valve function.  Signs of a past heart attack.  Fluid buildup around the heart.  Thickening of the heart muscle.  A tumor or infectious growth around the heart valves. Tell a health care provider about:  Any allergies you have.  All medicines you are taking, including vitamins, herbs, eye drops, creams, and over-the-counter medicines.  Any blood disorders you have.  Any surgeries you have had.  Any medical conditions you have.  Whether you are pregnant or may be pregnant. What are the risks? Generally, this is a safe procedure. However, problems may occur, including:  Allergic reaction to dye (contrast) that may be used during the procedure. What happens before the procedure? No specific preparation is needed. You may eat and drink normally. What happens during the procedure?   An IV tube may be inserted into one of your veins.  You may receive contrast through this  tube. A contrast is an injection that improves the quality of the pictures from your heart.  A gel will be applied to your chest.  A wand-like tool (transducer) will be moved over your chest. The gel will help to transmit the sound waves from the transducer.  The sound waves will harmlessly bounce off of your heart to allow the heart images to be  captured in real-time motion. The images will be recorded on a computer. The procedure may vary among health care providers and hospitals. What happens after the procedure?  You may return to your normal, everyday life, including diet, activities, and medicines, unless your health care provider tells you not to do that. Summary  An echocardiogram is a procedure that uses painless sound waves (ultrasound) to produce an image of the heart.  Images from an echocardiogram can provide important information about the size and shape of your heart, heart muscle function, heart valve function, and fluid buildup around your heart.  You do not need to do anything to prepare before this procedure. You may eat and drink normally.  After the echocardiogram is completed, you may return to your normal, everyday life, unless your health care provider tells you not to do that. This information is not intended to replace advice given to you by your health care provider. Make sure you discuss any questions you have with your health care provider. Document Revised: 11/28/2018 Document Reviewed: 09/09/2016 Elsevier Patient Education  2020 [employer].

## 2019-12-01 ENCOUNTER — Other Ambulatory Visit: Payer: Self-pay

## 2019-12-01 ENCOUNTER — Ambulatory Visit (INDEPENDENT_AMBULATORY_CARE_PROVIDER_SITE_OTHER): Payer: BC Managed Care – PPO

## 2019-12-01 DIAGNOSIS — R011 Cardiac murmur, unspecified: Secondary | ICD-10-CM

## 2019-12-01 NOTE — Progress Notes (Signed)
Complete echocardiogram has been performed.  Jimmy Teonna Coonan RDCS, RVT 

## 2019-12-30 ENCOUNTER — Ambulatory Visit: Payer: BC Managed Care – PPO | Admitting: Cardiology

## 2020-04-13 ENCOUNTER — Other Ambulatory Visit: Payer: Self-pay

## 2020-04-13 ENCOUNTER — Encounter: Payer: Self-pay | Admitting: Cardiology

## 2020-04-13 ENCOUNTER — Ambulatory Visit (INDEPENDENT_AMBULATORY_CARE_PROVIDER_SITE_OTHER): Payer: BC Managed Care – PPO | Admitting: Cardiology

## 2020-04-13 VITALS — BP 108/74 | HR 84 | Ht 66.0 in | Wt 208.4 lb

## 2020-04-13 DIAGNOSIS — R002 Palpitations: Secondary | ICD-10-CM | POA: Diagnosis not present

## 2020-04-13 DIAGNOSIS — R000 Tachycardia, unspecified: Secondary | ICD-10-CM | POA: Diagnosis not present

## 2020-04-13 NOTE — Patient Instructions (Signed)

## 2020-04-13 NOTE — Progress Notes (Signed)
Cardiology Office Note:    Date:  04/13/2020   ID:  Caitlin Davenport, DOB 02-04-1996, MRN [id number]  PCP:  Wilmer Floor., MD  Cardiologist:  Garwin Brothers, MD   Referring MD: Wilmer Floor., MD    ASSESSMENT:    1. Palpitations   2. Sinus tachycardia    PLAN:    In order of problems listed above:  1. Primary prevention stressed with the patient.  Importance of compliance with diet medication stressed and she vocalized understanding.  She is overweight and diet was emphasized and weight reduction was stressed.  She vocalized understanding and promises to do better. 2. Palpitations: These have resolved.  Patient not on beta-blocker and exercising regularly. 3. Sinus tachycardia: Resolved at this time. 4. Patient will be seen in follow-up appointment in 6 months or earlier if the patient has any concerns   Medication Adjustments/Labs and Tests Ordered: Current medicines are reviewed at length with the patient today.  Concerns regarding medicines are outlined above.  No orders of the defined types were placed in this encounter.  No orders of the defined types were placed in this encounter.    No chief complaint on file.    History of Present Illness:    Caitlin Davenport is a 24 y.o. female.  Patient was evaluated by me for sinus tachycardia.  She had Covid infection at that time.  She denies any problems and takes care of activities of daily living.  No chest pain orthopnea or PND.  The patient mentions to me that she has not treated for C. difficile infection.  At the time of my evaluation, the patient is alert awake oriented and in no distress.  Her palpitations have resolved.  Past Medical History:  Diagnosis Date  . Abdominal pain 08/31/2011  . Acute respiratory disease due to COVID-19 virus 06/13/2019  . Cardiac murmur 10/20/2019  . Drug induced headache 12/15/2013  . EEG abnormality 11/13/2012  . Hypersomnia 11/13/2012  . Hypothyroid   . Hypothyroidism  06/13/2019  . Palpitations 10/20/2019  . Partial epilepsy with impairment of consciousness (HCC) 12/15/2013   Formatting of this note might be different from the original. IMPRESSION: with postictal confusion and abnormal EEG.  . Polycystic ovarian syndrome   . Seizure (HCC) 06/13/2019  . Seizures (HCC)   . Sinus tachycardia 10/20/2019    Past Surgical History:  Procedure Laterality Date  . NO PAST SURGERIES      Current Medications: Current Meds  Medication Sig  . acetaminophen (TYLENOL) 325 MG tablet Take 2 tablets (650 mg total) by mouth every 6 (six) hours as needed.  Marland Kitchen ibuprofen (ADVIL) 600 MG tablet ibuprofen 600 mg tablet  . JUNEL FE 1/20 1-20 MG-MCG tablet Take 1 tablet by mouth daily.  Marland Kitchen levETIRAcetam (KEPPRA XR) 500 MG 24 hr tablet Take 1,000 mg by mouth at bedtime.  Marland Kitchen levothyroxine (SYNTHROID) 75 MCG tablet Take 75 mcg by mouth at bedtime.  Marland Kitchen LORazepam (ATIVAN) 0.5 MG tablet lorazepam 0.5 mg tablet  TAKE 1 TABLET BY MOUTH TWICE (2) DAILY AS NEEDED  . metroNIDAZOLE (FLAGYL) 500 MG tablet Take 500 mg by mouth in the morning, at noon, in the evening, and at bedtime.  . ondansetron (ZOFRAN) 4 MG tablet Take 4 mg by mouth as needed.  . phentermine (ADIPEX-P) 37.5 MG tablet Take 18.75 mg by mouth daily.  . Vitamin D, Ergocalciferol, (DRISDOL) 1.25 MG (50000 UT) CAPS capsule Take 50,000 Units by mouth every Friday.  Allergies:   Lubiprostone   Social History   Socioeconomic History  . Marital status: Single    Spouse name: Not on file  . Number of children: Not on file  . Years of education: Not on file  . Highest education level: Not on file  Occupational History  . Not on file  Tobacco Use  . Smoking status: Never Smoker  . Smokeless tobacco: Never Used  Vaping Use  . Vaping Use: Never used  Substance and Sexual Activity  . Alcohol use: Yes    Comment: occ  . Drug use: Never  . Sexual activity: Not on file  Other Topics Concern  . Not on file  Social History  Narrative   Patient lives at home with her mother and her mother's boyfriend. Works a regular job as a Production designer, theatre/television/film at [employer].    Social Determinants of Health   Financial Resource Strain:   . Difficulty of Paying Living Expenses: Not on file  Food Insecurity: No Food Insecurity  . Worried About Programme researcher, broadcasting/film/video in the Last Year: Never true  . Ran Out of Food in the Last Year: Never true  Transportation Needs: No Transportation Needs  . Lack of Transportation (Medical): No  . Lack of Transportation (Non-Medical): No  Physical Activity: Sufficiently Active  . Days of Exercise per Week: 3 days  . Minutes of Exercise per Session: 60 min  Stress: No Stress Concern Present  . Feeling of Stress : Not at all  Social Connections: Unknown  . Frequency of Communication with Friends and Family: More than three times a week  . Frequency of Social Gatherings with Friends and Family: More than three times a week  . Attends Religious Services: Patient refused  . Active Member of Clubs or Organizations: Patient refused  . Attends Banker Meetings: Patient refused  . Marital Status: Patient refused     Family History: The patient's family history includes Breast cancer in her maternal grandmother; Esophageal cancer in her maternal grandfather.  ROS:   Please see the history of present illness.    All other systems reviewed and are negative.  EKGs/Labs/Other Studies Reviewed:    The following studies were reviewed today: IMPRESSIONS    1. Left ventricular ejection fraction, by estimation, is 60 to 65%. The  left ventricle has normal function. The left ventricle has no regional  wall motion abnormalities. Left ventricular diastolic parameters were  normal.  2. Right ventricular systolic function is normal. The right ventricular  size is normal. There is normal pulmonary artery systolic pressure.  3. The mitral valve is normal in structure. No evidence of mitral valve    regurgitation. No evidence of mitral stenosis.  4. The aortic valve is normal in structure. Aortic valve regurgitation is  not visualized. No aortic stenosis is present.  5. The inferior vena cava is normal in size with greater than 50%  respiratory variability, suggesting right atrial pressure of 3 mmHg.    Recent Labs: 06/17/2019: ALT 57; BUN 15; Creatinine, Ser 0.74; Hemoglobin 14.2; Platelets 227; Potassium 4.1; Sodium 139  Recent Lipid Panel No results found for: CHOL, TRIG, HDL, CHOLHDL, VLDL, LDLCALC, LDLDIRECT  Physical Exam:    VS:  BP 108/74   Pulse 84   Ht 5\' 6"  (1.676 m)   Wt 208 lb 6.4 oz (94.5 kg)   SpO2 98%   BMI 33.64 kg/m     Wt Readings from Last 3 Encounters:  04/13/20 208 lb  6.4 oz (94.5 kg)  10/20/19 221 lb (100.2 kg)  06/13/19 186 lb 8 oz (84.6 kg)     GEN: Patient is in no acute distress HEENT: Normal NECK: No JVD; No carotid bruits LYMPHATICS: No lymphadenopathy CARDIAC: Hear sounds regular, 2/6 systolic murmur at the apex. RESPIRATORY:  Clear to auscultation without rales, wheezing or rhonchi  ABDOMEN: Soft, non-tender, non-distended MUSCULOSKELETAL:  No edema; No deformity  SKIN: Warm and dry NEUROLOGIC:  Alert and oriented x 3 PSYCHIATRIC:  Normal affect   Signed, Garwin Brothers, MD  04/13/2020 4:44 PM    [facility]

## 2020-07-13 ENCOUNTER — Other Ambulatory Visit: Payer: Self-pay | Admitting: *Deleted

## 2020-07-13 DIAGNOSIS — R519 Headache, unspecified: Secondary | ICD-10-CM

## 2020-07-31 ENCOUNTER — Other Ambulatory Visit: Payer: BC Managed Care – PPO

## 2023-12-28 ENCOUNTER — Ambulatory Visit: Admitting: Internal Medicine

## 2023-12-28 ENCOUNTER — Encounter: Payer: Self-pay | Admitting: Internal Medicine

## 2023-12-28 VITALS — BP 124/86 | HR 105 | Resp 16 | Ht 66.0 in | Wt 228.2 lb

## 2023-12-28 DIAGNOSIS — T783XXD Angioneurotic edema, subsequent encounter: Secondary | ICD-10-CM | POA: Diagnosis not present

## 2023-12-28 DIAGNOSIS — K200 Eosinophilic esophagitis: Secondary | ICD-10-CM | POA: Diagnosis not present

## 2023-12-28 DIAGNOSIS — J3089 Other allergic rhinitis: Secondary | ICD-10-CM

## 2023-12-28 DIAGNOSIS — R197 Diarrhea, unspecified: Secondary | ICD-10-CM

## 2023-12-28 DIAGNOSIS — R0789 Other chest pain: Secondary | ICD-10-CM | POA: Diagnosis not present

## 2023-12-28 DIAGNOSIS — T783XXA Angioneurotic edema, initial encounter: Secondary | ICD-10-CM

## 2023-12-28 DIAGNOSIS — G90A Postural orthostatic tachycardia syndrome (POTS): Secondary | ICD-10-CM

## 2023-12-28 MED ORDER — CETIRIZINE HCL 10 MG PO TABS: 10.0000 mg | ORAL_TABLET | Freq: Two times a day (BID) | ORAL | 5 refills | Status: AC

## 2023-12-28 MED ORDER — FAMOTIDINE 20 MG PO TABS: 20.0000 mg | ORAL_TABLET | Freq: Every day | ORAL | 5 refills | Status: AC

## 2023-12-28 NOTE — Progress Notes (Signed)
 NEW PATIENT Date of Service/Encounter:  12/28/23 Referring provider: Fleta Human, NP Primary care provider: Alonso Jan., MD  Subjective:  Caitlin Davenport is a 28 y.o. female  presenting today for evaluation of EoE, long covid, POTS, mcas concern  History obtained from: chart review and patient.   Discussed the use of AI scribe software for clinical note transcription with the patient, who gave verbal consent to proceed.  History of Present Illness Caitlin Davenport is a 28 year old female with eosinophilic esophagitis who presents with chest pain and concerns about food allergies.  She was diagnosed with eosinophilic esophagitis last year and experiences severe chest pain, described as 'really, really bad pain' that can last for about ten hours and occurs approximately three times a month. The pain is located in her chest and can radiate to her back. She is currently taking omeprazole for EOE, which she has been using consistently. Her last endoscopy in 11/24showed  no eosinophils in the distal or proximal esophagus. Initial EGD in 3/34 showed 35 eos/HPF in distal esophagus.  Both EGD showed normal appearing esophagus and stomach.  She was started on omeprazole after the first EGD.   She experiences symptoms she describes as 'flares,' including random lip swelling, facial redness, and sinus congestion, occurring four to five times a week. She has been taking Zyrtec and Pepcid  on an as-needed basis, typically two to three times a week, but not consistently, and has not noticed significant improvement. Her long COVID team at [facility] has considered mast cell activation syndrome as a potential diagnosis, but no formal testing has been conducted yet.  She also experiences abdominal pain and diarrhea, which sometimes coincide with her other symptoms. She has not identified specific food triggers and describes her symptoms as 'very random.' She has tried to manage her symptoms with Zyrtec and Pepcid ,  but has not been taking them consistently. She is interested in allergy testing.    She has a history of long COVID, which she believes has exacerbated her symptoms. She has not undergone any motility testing for her esophagus and wants to explore potential food allergies to manage her EOE better. She is open to dietary changes to avoid pain, even if it means avoiding her favorite foods.    Other allergy screening: Asthma: no Rhino conjunctivitis: no Food allergy: EoE symptoms as aboe Medication allergy: no Hymenoptera allergy: no Urticaria: angioedema Eczema:no History of recurrent infections suggestive of immunodeficency: no Vaccinations are up to date.   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 IMPRESSION: with postictal confusion and abnormal EEG.   Polycystic ovarian syndrome    Seizure (HCC) 06/13/2019   Seizures (HCC)    Sinus tachycardia 10/20/2019   Medication List:  Current Outpatient Medications  Medication Sig Dispense Refill   cetirizine (ZYRTEC) 10 MG tablet Take 1 tablet (10 mg total) by mouth 2 (two) times daily. 60 tablet 5   famotidine  (PEPCID ) 20 MG tablet Take 1 tablet (20 mg total) by mouth daily. 30 tablet 5   levothyroxine  (SYNTHROID ) 75 MCG tablet Take 75 mcg by mouth  at bedtime.     metoprolol succinate (TOPROL-XL) 25 MG 24 hr tablet Take 25 mg by mouth.     omeprazole (PRILOSEC) 40 MG capsule Take 40 mg by mouth daily.     No current facility-administered medications for this visit.   Known Allergies:  Allergies  Allergen Reactions    Lubiprostone     Other reaction(s): Respiratory Distress   Past Surgical History: Past Surgical History:  Procedure Laterality Date   NO PAST SURGERIES     Family History: Family History  Problem Relation Age of Onset   Breast cancer Maternal Grandmother    Esophageal cancer Maternal Grandfather    Social History: Jasly lives single-family home in 28 years old.  No water damage in the house and hardwood throughout.  Electric heating, central cooling.  Cats and dogs and parents alive inside the bedroom.  No roaches in the house and bed to be off floor.  No dust mite precautions.  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  Not to the fumes, chemicals or dust..   ROS:  All other systems negative except as noted per HPI.  Objective:  Blood pressure 124/86, pulse (!) 105, resp. rate 16, height 5\' 6"  (1.676 m), weight 228 lb 3.2 oz (103.5 kg), SpO2 97%. Body mass index is 36.83 kg/m. Physical Exam:  General Appearance:  Alert, cooperative, no distress, appears stated age  Head:  Normocephalic, without obvious abnormality, atraumatic  Eyes:  Conjunctiva clear, EOM's intact  Ears EACs normal bilaterally  Nose: Nares normal, hypertrophic turbinates, normal mucosa, no visible anterior polyps, and septum midline  Throat: Lips, tongue normal; teeth and gums normal, normal posterior oropharynx  Neck: Supple, symmetrical  Lungs:   clear to auscultation bilaterally, Respirations unlabored, no coughing  Heart:  regular rate and rhythm and no murmur, Appears well perfused  Extremities: No edema  Skin: Skin color, texture, turgor normal and no rashes or lesions on visualized portions of skin  Neurologic: No gross deficits   Diagnostics: None done    Labs:  Lab Orders         Tryptase         CBC With Diff/Platelet         Alpha-Gal Panel       Assessment and Plan  Assessment and Plan Assessment and Plan Assessment & Plan Eosinophilic esophagitis (EOE) Current symptoms  and biopsy suggest reflux-induced eosinophilia rather than EOE. Omeprazole reduced eosinophils from 25 to 2 per high power field. Differential includes nutcracker esophagus or distal esophageal spasm. EOE treatable with current therapies. Discussed limitations of food allergy testing and empirical elimination diets. - Continue omeprazole indefinitely. - Refer to [facility] in [place] for second opinion on EOE and esophageal motility disorders. - Consider esophageal motility testing for possible esophageal spasm. - Start peppermint: 2 altoids dissolved under the tongue prior to eating   Possible esophageal spasm Severe chest pain possibly indicative of nutcracker esophagus or distal esophageal spasm. Symptoms mimic myocardial infarction. Discussed potential benefit of peppermint for esophageal relaxation. - Consider esophageal motility testing for diagnosis of esophageal spasm. - Advise taking two Altoid mints sublingually before meals to relax the esophagus.  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 No formal diagnosis yet. Discussed need for consistent antihistamine use and potential benefits of Singulair and aspirin. - Order initial blood work for [employer]. - Schedule allergy testing (1-68) - Instruct to take two Zyrtec daily  -  Start Pepcid  20mg  daily  - Consider adding Singulair for MCAS management. - Discuss potential use of aspirin for flushing symptoms.   Follow up: for allergy skin testing (1-68), hold pepcid  and zyrtec 3 days prior   Thank you so much for letting me partake in your care today.  Don't hesitate to reach out if you have any additional concerns!  Orelia Binet, MD  [facility] [place] [place]       This note in its entirety was forwarded to the Provider who requested this consultation.  Other:    Thank you for your kind referral. I appreciate  the opportunity to take part in Eulanda's care. Please do not hesitate to contact me with questions.  Sincerely,  Thank you so much for letting me partake in your care today.  Don't hesitate to reach out if you have any additional concerns!  Orelia Binet, MD  [facility] [place], [place]

## 2023-12-28 NOTE — Patient Instructions (Signed)
 Eosinophilic esophagitis (EOE) Current symptoms and biopsy suggest reflux-induced eosinophilia rather than EOE. Omeprazole reduced eosinophils from 25 to 2 per high power field. Differential includes nutcracker esophagus or distal esophageal spasm. EOE treatable with current therapies. Discussed limitations of food allergy testing and empirical elimination diets. - Continue omeprazole indefinitely. - Refer to [facility] in [place] for second opinion on EOE and esophageal motility disorders. - Consider esophageal motility testing for possible esophageal spasm.   Possible esophageal spasm Severe chest pain possibly indicative of nutcracker esophagus or distal esophageal spasm. Symptoms mimic myocardial infarction. Discussed potential benefit of peppermint for esophageal relaxation. - Consider esophageal motility testing for diagnosis of esophageal spasm. - Advise taking two Altoid mints sublingually before meals to relax the esophagus.  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 No formal diagnosis yet. Discussed need for consistent antihistamine use and potential benefits of Singulair and aspirin. - Order initial blood work for [employer]. - Schedule allergy testing (1-68) - Instruct to take two Zyrtec daily  - Start Pepcid  20mg  daily  - Consider adding Singulair for MCAS management. - Discuss potential use of aspirin for flushing symptoms.   Follow up: for allergy skin testing (1-68), hold pepcid  and zyrtec 3 days prior   Thank you so much for letting me partake in your care today.  Don't hesitate to reach out if you have any additional concerns!  Orelia Binet, MD  [facility] [place], [place]

## 2023-12-31 LAB — CBC WITH DIFF/PLATELET
Basophils Absolute: 0.1 10*3/uL (ref 0.0–0.2)
Basos: 1 %
EOS (ABSOLUTE): 0.2 10*3/uL (ref 0.0–0.4)
Eos: 3 %
Hematocrit: 44 % (ref 34.0–46.6)
Hemoglobin: 14.7 g/dL (ref 11.1–15.9)
Immature Grans (Abs): 0 10*3/uL (ref 0.0–0.1)
Immature Granulocytes: 0 %
Lymphocytes Absolute: 2 10*3/uL (ref 0.7–3.1)
Lymphs: 28 %
MCH: 30.2 pg (ref 26.6–33.0)
MCHC: 33.4 g/dL (ref 31.5–35.7)
MCV: 91 fL (ref 79–97)
Monocytes Absolute: 0.5 10*3/uL (ref 0.1–0.9)
Monocytes: 7 %
Neutrophils Absolute: 4.5 10*3/uL (ref 1.4–7.0)
Neutrophils: 61 %
Platelets: 290 10*3/uL (ref 150–450)
RBC: 4.86 x10E6/uL (ref 3.77–5.28)
RDW: 12 % (ref 11.7–15.4)
WBC: 7.2 10*3/uL (ref 3.4–10.8)

## 2023-12-31 LAB — ALPHA-GAL PANEL: IgE (Immunoglobulin E), Serum: 48 [IU]/mL (ref 6–495)

## 2023-12-31 LAB — TRYPTASE: Tryptase: 6.9 ug/L (ref 2.2–13.2)

## 2024-01-17 ENCOUNTER — Telehealth: Payer: Self-pay | Admitting: Gastroenterology

## 2024-01-17 NOTE — Telephone Encounter (Signed)
 Good Morning Dr Cherryl Corona   Patient preferred provider  We have a referral for patient to be seen for Eoinophilic  esophagitis.   Patient has previous work up with [facility].   Requesting transfer due to you being referred to patient from her allergist.   Please review and advise on scheduling.   Thank you

## 2024-01-20 NOTE — Progress Notes (Deleted)
   [address] [place] [address] Dept: [phone]  FOLLOW UP NOTE  Patient ID: Caitlin Davenport, female    DOB: 1996/08/07  Age: 28 y.o. MRN: [id number] Date of Office Visit: 01/21/2024  Assessment  Chief Complaint: No chief complaint on file.  HPI Caitlin Davenport is a 28 year old female who presents to the clinic for a follow up visit with food allergy skin testing. She was last seen in this clinic on 12/28/2023 by Dr. Jolayne Natter for evaluation of EOE, angioedema possible food allergy, and possible MCAS.   Discussed the use of AI scribe software for clinical note transcription with the patient, who gave verbal consent to proceed.  History of Present Illness      Drug Allergies:  Allergies  Allergen Reactions   Lubiprostone     Other reaction(s): Respiratory Distress    Physical Exam: There were no vitals taken for this visit.   Physical Exam  Diagnostics:    Assessment and Plan: No diagnosis found.  No orders of the defined types were placed in this encounter.   There are no Patient Instructions on file for this visit.  No follow-ups on file.    Thank you for the opportunity to care for this patient.  Please do not hesitate to contact me with questions.  Marinus Sic, FNP [facility] [place]     \

## 2024-01-20 NOTE — Patient Instructions (Incomplete)
 Eosinophilic esophagitis (EOE) Current symptoms and biopsy suggest reflux-induced eosinophilia rather than EOE. Omeprazole reduced eosinophils from 25 to 2 per high power field. Differential includes nutcracker esophagus or distal esophageal spasm. EOE treatable with current therapies. Discussed limitations of food allergy testing and empirical elimination diets. - Continue omeprazole indefinitely. - Refer to [facility] in [place] for second opinion on EOE and esophageal motility disorders. - Consider esophageal motility testing for possible esophageal spasm.   Possible esophageal spasm Severe chest pain possibly indicative of nutcracker esophagus or distal esophageal spasm. Symptoms mimic myocardial infarction. Discussed potential benefit of peppermint for esophageal relaxation. - Consider esophageal motility testing for diagnosis of esophageal spasm. - Advise taking two Altoid mints sublingually before meals to relax the esophagus.  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 No formal diagnosis yet. Discussed need for consistent antihistamine use and potential benefits of Singulair and aspirin. - Order initial blood work for [employer]. - Schedule allergy testing (1-68) - Instruct to take two Zyrtec  daily  - Start Pepcid  20mg  daily  - Consider adding Singulair for MCAS management. - Discuss potential use of aspirin for flushing symptoms.

## 2024-01-21 ENCOUNTER — Ambulatory Visit: Payer: Self-pay | Admitting: Family Medicine

## 2024-01-21 DIAGNOSIS — J309 Allergic rhinitis, unspecified: Secondary | ICD-10-CM

## 2024-01-22 ENCOUNTER — Ambulatory Visit: Payer: Self-pay | Admitting: Internal Medicine

## 2024-01-22 NOTE — Progress Notes (Signed)
 Alpha gal, tryptase and blood counts were all normal.  This makes Mast cell disorders much less likley.  Recommend continuing current plan, follow up for skin testing as planned and follow up with GI as planned

## 2024-02-29 NOTE — Telephone Encounter (Signed)
 Called patient to further advise on scheduling. Unable to leave voicemail.

## 2024-05-28 ENCOUNTER — Ambulatory Visit (HOSPITAL_BASED_OUTPATIENT_CLINIC_OR_DEPARTMENT_OTHER): Payer: Self-pay

## 2024-07-23 ENCOUNTER — Ambulatory Visit (HOSPITAL_BASED_OUTPATIENT_CLINIC_OR_DEPARTMENT_OTHER): Payer: Self-pay
